# Patient Record
Sex: Female | Born: 1985 | Race: White | Hispanic: No | Marital: Married | Smoking: Never smoker
Health system: Southern US, Community
[De-identification: ages and names within clinical notes are randomized; demographics above are authoritative.]

## PROBLEM LIST (undated history)

## (undated) ENCOUNTER — Inpatient Hospital Stay (HOSPITAL_COMMUNITY): Payer: Self-pay

## (undated) DIAGNOSIS — F988 Other specified behavioral and emotional disorders with onset usually occurring in childhood and adolescence: Secondary | ICD-10-CM

## (undated) DIAGNOSIS — Z8711 Personal history of peptic ulcer disease: Secondary | ICD-10-CM

## (undated) DIAGNOSIS — Z9889 Other specified postprocedural states: Secondary | ICD-10-CM

## (undated) DIAGNOSIS — D649 Anemia, unspecified: Secondary | ICD-10-CM

## (undated) DIAGNOSIS — U071 COVID-19: Secondary | ICD-10-CM

## (undated) DIAGNOSIS — Z860101 Personal history of adenomatous and serrated colon polyps: Secondary | ICD-10-CM

## (undated) DIAGNOSIS — Z800 Family history of malignant neoplasm of digestive organs: Secondary | ICD-10-CM

## (undated) DIAGNOSIS — K219 Gastro-esophageal reflux disease without esophagitis: Secondary | ICD-10-CM

## (undated) DIAGNOSIS — N393 Stress incontinence (female) (male): Secondary | ICD-10-CM

## (undated) DIAGNOSIS — Z86010 Personal history of colonic polyps: Secondary | ICD-10-CM

## (undated) DIAGNOSIS — T7840XA Allergy, unspecified, initial encounter: Secondary | ICD-10-CM

## (undated) DIAGNOSIS — K589 Irritable bowel syndrome without diarrhea: Secondary | ICD-10-CM

## (undated) DIAGNOSIS — F32A Depression, unspecified: Secondary | ICD-10-CM

## (undated) DIAGNOSIS — R112 Nausea with vomiting, unspecified: Secondary | ICD-10-CM

## (undated) DIAGNOSIS — Z8719 Personal history of other diseases of the digestive system: Secondary | ICD-10-CM

## (undated) DIAGNOSIS — Z87442 Personal history of urinary calculi: Secondary | ICD-10-CM

## (undated) DIAGNOSIS — Z8619 Personal history of other infectious and parasitic diseases: Secondary | ICD-10-CM

## (undated) DIAGNOSIS — F419 Anxiety disorder, unspecified: Secondary | ICD-10-CM

## (undated) HISTORY — DX: Irritable bowel syndrome, unspecified: K58.9

## (undated) HISTORY — DX: Allergy, unspecified, initial encounter: T78.40XA

## (undated) HISTORY — DX: Personal history of other infectious and parasitic diseases: Z86.19

## (undated) HISTORY — PX: OTHER SURGICAL HISTORY: SHX169

## (undated) HISTORY — PX: COLONOSCOPY W/ POLYPECTOMY: SHX1380

---

## 2005-03-22 ENCOUNTER — Ambulatory Visit (HOSPITAL_COMMUNITY): Admission: RE | Admit: 2005-03-22 | Discharge: 2005-03-22 | Payer: Self-pay | Admitting: Family Medicine

## 2005-05-24 ENCOUNTER — Other Ambulatory Visit: Admission: RE | Admit: 2005-05-24 | Discharge: 2005-05-24 | Payer: Self-pay | Admitting: Obstetrics and Gynecology

## 2005-10-07 ENCOUNTER — Encounter (HOSPITAL_COMMUNITY): Admission: RE | Admit: 2005-10-07 | Discharge: 2005-10-18 | Payer: Self-pay | Admitting: Family Medicine

## 2005-10-11 ENCOUNTER — Ambulatory Visit: Payer: Self-pay | Admitting: Internal Medicine

## 2005-10-18 ENCOUNTER — Encounter (INDEPENDENT_AMBULATORY_CARE_PROVIDER_SITE_OTHER): Payer: Self-pay | Admitting: General Surgery

## 2005-10-18 ENCOUNTER — Ambulatory Visit (HOSPITAL_COMMUNITY): Admission: RE | Admit: 2005-10-18 | Discharge: 2005-10-18 | Payer: Self-pay | Admitting: General Surgery

## 2005-10-18 HISTORY — PX: LAPAROSCOPIC CHOLECYSTECTOMY: SUR755

## 2005-11-15 ENCOUNTER — Ambulatory Visit: Payer: Self-pay | Admitting: Internal Medicine

## 2005-11-15 ENCOUNTER — Encounter (INDEPENDENT_AMBULATORY_CARE_PROVIDER_SITE_OTHER): Payer: Self-pay | Admitting: *Deleted

## 2005-11-15 DIAGNOSIS — Z860100 Personal history of colon polyps, unspecified: Secondary | ICD-10-CM | POA: Insufficient documentation

## 2005-12-13 ENCOUNTER — Ambulatory Visit: Payer: Self-pay | Admitting: Internal Medicine

## 2005-12-26 ENCOUNTER — Ambulatory Visit: Payer: Self-pay | Admitting: Internal Medicine

## 2006-02-04 ENCOUNTER — Emergency Department (HOSPITAL_COMMUNITY): Admission: EM | Admit: 2006-02-04 | Discharge: 2006-02-05 | Payer: Self-pay | Admitting: Emergency Medicine

## 2006-06-23 ENCOUNTER — Other Ambulatory Visit: Admission: RE | Admit: 2006-06-23 | Discharge: 2006-06-23 | Payer: Self-pay | Admitting: Obstetrics and Gynecology

## 2007-04-09 ENCOUNTER — Emergency Department (HOSPITAL_COMMUNITY): Admission: EM | Admit: 2007-04-09 | Discharge: 2007-04-09 | Payer: Self-pay | Admitting: Emergency Medicine

## 2007-11-06 ENCOUNTER — Other Ambulatory Visit: Admission: RE | Admit: 2007-11-06 | Discharge: 2007-11-06 | Payer: Self-pay | Admitting: Obstetrics and Gynecology

## 2007-11-23 IMAGING — NM NM HEPATO W/GB/PHARM/[PERSON_NAME]
2 series · 12 of 12 positions shown · non-contrast
Comparison: none

HISTORY: Abdominal pain

[Series 1: gb hepatobiliary scan · 3.27mm/px · 6 of 60 frames shown (1 of 2)]
[frame 6/60]
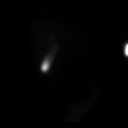
[frame 16/60]
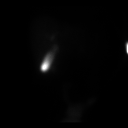
[frame 26/60]
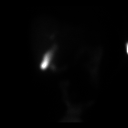
[frame 36/60]
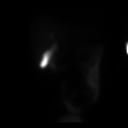
[frame 46/60]
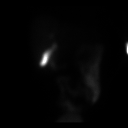
[frame 56/60]
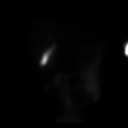

[Series 1: gb hepatobiliary scan · 3.27mm/px · 6 of 60 frames shown (2 of 2)]
[frame 6/60]
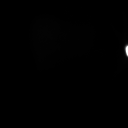
[frame 16/60]
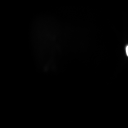
[frame 26/60]
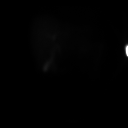
[frame 36/60]
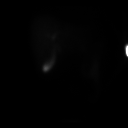
[frame 46/60]
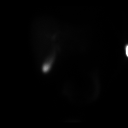
[frame 56/60]
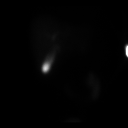

[12 of 12 positions shown; findings below may reference images not displayed]

HEPATOBILIARY SCAN WITH EJECTION FRACTION:

Hepatobiliary imaging performed using 5 mCi 1c-IIm mebrofenin.

Prompt tracer extraction from blood stream, indicating normal hepatocellular
function.
Prompt excretion of tracer into biliary tree.
Gallbladder visualized x 15 minutes.
Small bowel activity seen x 25 minutes.
No hepatic retention of tracer.

At 1 hour, patient ingested half-and-half, and imaging continued for 60 minutes.
Only mild emptying of tracer occurs from gallbladder following fatty meal
stimulation.
Calculated gallbladder ejection fraction decreased at 32%.
IMPRESSION: Patent biliary tree.
Abnormal gallbladder response to fatty meal stimulation with decreased GB EF of
32%.

## 2008-12-07 ENCOUNTER — Other Ambulatory Visit: Admission: RE | Admit: 2008-12-07 | Discharge: 2008-12-07 | Payer: Self-pay | Admitting: Obstetrics and Gynecology

## 2008-12-29 ENCOUNTER — Ambulatory Visit: Payer: Self-pay | Admitting: Internal Medicine

## 2009-01-10 ENCOUNTER — Ambulatory Visit: Payer: Self-pay | Admitting: Internal Medicine

## 2010-03-01 ENCOUNTER — Telehealth: Payer: Self-pay | Admitting: Internal Medicine

## 2010-03-06 ENCOUNTER — Other Ambulatory Visit: Admission: RE | Admit: 2010-03-06 | Discharge: 2010-03-06 | Payer: Self-pay | Admitting: Obstetrics and Gynecology

## 2010-03-28 ENCOUNTER — Ambulatory Visit: Payer: Self-pay | Admitting: Internal Medicine

## 2010-03-28 DIAGNOSIS — R198 Other specified symptoms and signs involving the digestive system and abdomen: Secondary | ICD-10-CM | POA: Insufficient documentation

## 2010-03-28 DIAGNOSIS — K589 Irritable bowel syndrome without diarrhea: Secondary | ICD-10-CM | POA: Insufficient documentation

## 2010-03-28 DIAGNOSIS — K649 Unspecified hemorrhoids: Secondary | ICD-10-CM | POA: Insufficient documentation

## 2010-07-26 ENCOUNTER — Ambulatory Visit: Payer: Self-pay | Admitting: Internal Medicine

## 2010-11-29 NOTE — Progress Notes (Signed)
Summary: Rectal Bleed  Phone Note Call from Patient   Caller: Aundria Mems [phone] Call For: Dr Leone Payor Reason for Call: Talk to Nurse Summary of Call: Having some rectal bleeding. Wonders if she can be seen fairly soon. Initial call taken by: Leanor [facility],  Mar 01, 2010 8:12 AM  Follow-up for Phone Call        Patient c/ rectal bleeding, itching and burning.  She sees blood on the tissue when she wipes.  Patient  is scheduled for 03-28-10 2:30.  Patient instructed to soak in tub of warm water/sitz bath 10 minutes BID, Tucs pads, meticulous cleaning between bowel movements and apply OTC hydrocortisone cream or Prep H.  She will call back for worsening symptoms before her appointment  Follow-up by: Darcey Nora RN, CGRN,  Mar 01, 2010 9:31 AM

## 2010-11-29 NOTE — Consult Note (Signed)
Summary: GI Consult/[facility]  GI Consult/[facility]   Imported By: Sherian Rein 03/29/2010 09:31:28  _____________________________________________________________________  External Attachment:    Type:   Image     Comment:   External Document

## 2010-11-29 NOTE — Letter (Signed)
Summary: Education officer, [facility]   Imported By: Sherian Rein 03/29/2010 09:32:48  _____________________________________________________________________  External Attachment:    Type:   Image     Comment:   External Document

## 2010-11-29 NOTE — Assessment & Plan Note (Signed)
Summary: rectal bleeding/hx of polyps/sheri   History of Present Illness Visit Type: Follow-up Visit Primary GI MD: Stan Head MD [facility] Chief Complaint: Lower abd pain and rectal pain with BM. Pt noticed a episode of black stools and also has rectal bleeding with some BM's. Also has acid reflux and sour taste in mouth.  History of Present Illness:   25 yo white woman een in past for gallbladder dyskinesia, suspected IBS and rectal adenoma. Laast seen at 2010 colonoscopy. She recently called about rectal bleeding. Is having 3-4 stools a day that are variable from loose to hard and different sizes. Dark stools occurred after Pepto-Bismol. In the AM she has a strange sensation like there is something moving around in the abdomen. She had bright red blood on the tissue paper and on the stool at times. that has stopped x 2 weeks. She used preparation H with benefit. Also having halitosis problems, ntes after peanuts, pizza, ? sub sandwich (steak and cheese). She clears her throat alot and has phlegm that she expectorates intermittently. She was evaluatd by ENT (? Jearld Fenton) and was told GERD is a possibility as a cause. she reduced from 1-2 cups of coffee. CT of sinuses ok she says, and was having sinus or post-nasal drainage. also busy with school, stressed though that has subsided some.  due to graduate 11/11   GI Review of Systems    Reports abdominal pain, acid reflux, and  belching.     Location of  Abdominal pain: lower abdomen.    Denies bloating, chest pain, dysphagia with liquids, dysphagia with solids, heartburn, loss of appetite, nausea, vomiting, vomiting blood, weight loss, and  weight gain.      Reports change in bowel habits, hemorrhoids, rectal bleeding, and  rectal pain.     Denies anal fissure, black tarry stools, diarrhea, diverticulosis, fecal incontinence, heme positive stool, irritable bowel syndrome, jaundice, light color stool, and  liver problems. Preventive  Screening-Counseling & Management  Alcohol-Tobacco     Smoking Status: never      Drug Use:  no.      Current Medications (verified): 1)  Ortho Tri-Cyclen Lo 0.18/0.215/0.25 Mg-25 Mcg Tabs (Norgestim-Eth Estrad Triphasic) .... One Tablet By Mouth Once Daily 2)  Ibuprofen 800 Mg Tabs (Ibuprofen) .Marland Kitchen.. 1 By Mouth Prn  Allergies (verified): 1)  Vicodin 2)  Codeine  Past History:  Past Medical History: ADD Allergy/Sinus Adenomatous Colon Polyps Gallstones Kidney Stones Hemorrhoids IBS?  Past Surgical History: Reviewed history from 03/23/2010 and no changes required. 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  Alcohol Use - yes Daily Caffeine Use Illicit Drug Use - no Smoking Status:  never Drug Use:  no  Review of Systems       as per HPI  Vital Signs:  Patient profile:   25 year old female Height:      63.25 inches Weight:      122.38 pounds BMI:     21.59 Pulse rate:   64 / minute Pulse rhythm:   regular BP sitting:   108 / 62  (right arm) Cuff size:   regular  Vitals Entered By: Christie Nottingham CMA Duncan Dull) (March 28, 2010 2:52 PM)  Physical Exam  General:  Well developed, well nourished, no acute distress. Eyes:  PERRLA, no icterus. Mouth:  No deformity or lesions, dentition normal. Neck:  Supple; no masses or thyromegaly.  Lungs:  Clear throughout to auscultation. Heart:  Regular rate and rhythm; no murmurs, rubs,  or bruits. Abdomen:  Soft, nontender and nondistended. No masses, hepatosplenomegaly or hernias noted. Normal bowel sounds. Rectal:  inspected with female staff present no perianal changes noted, small external hemorhoids visible in canal   Impression & Recommendations:  Problem # 1:  HEMORRHOIDS, WITH BLEEDING (ICD-455.8) Assessment Deteriorated known problem from prior colonoscopies with  signs and symptoms compatible since she called this has resolved so observe for now and try to improve bowel habit  Problem # 2:  CHANGE IN BOWELS (ZOX-096.04) Assessment: Deteriorated Seems like an exacerbation of IBS. Duodenal bxs normal in 2007 but will do bloodwork for celiac disease. Orders: TLB-IgA (Immunoglobulin A) ([id number]) T-Tissue Transglutamase Ab IgA ([id number])  Problem # 3:  IRRITABLE BOWEL SYNDROME (ICD-564.1) Assessment: Deteriorated I think this is her main problem as she has had chronic, intermittent abdominal pain, bloating and bowel habit changes. Colonoscopies x 2 have not shown other disease to explain and duodenal bxs 2007 negative for celiac disease. Will look further for celiac disease with TTG Ab and IgA level today. Fiber (metamucil) and Align are rxed.  Problem # 4:  ? of GERD (ICD-530.81) Assessment: New ENT symptoms and some upper abdominal pain raise the ? Trial of PPI at once daily Nexium 40 mg Reassess in 2 months  Patient Instructions: 1)  Please go to the basement to have your lab tests drawn today. 2)  Take 1 teaspoon of metamucil daily x 1 week, then 2 teaspoons daily x 1 week then 3 teaspoons (1 tablespoon) daiy. May adjust dose as needed, up or down.l  3)  Please pick up your medications at your pharmacy. 4)  You are starting Nexium as written below. 5)  Also start Align 1 each day as written below for 1 month then stop (early July) 6)  Please schedule a follow-up appointment in 2 months.  7)  Copy sent to : [facility] 8)  The medication list was reviewed and reconciled.  All changed / newly prescribed medications were explained.  A complete medication list was provided to the patient / caregiver. Prescriptions: NEXIUM 40 MG PACK (ESOMEPRAZOLE MAGNESIUM) 1 by mouth 30 minutes before breakfast  #30 x 1   Entered and Authorized by:   Iva Boop MD, [facility]   Signed by:   Iva Boop MD, FACG on 03/28/2010   Method used:    Electronically to        [facility] (retail)       [address]       [place], [place]  [address]       Ph: [phone]       Fax: [phone]   RxID:   [id number]  cc: Suzanna Obey, MD

## 2010-11-29 NOTE — Procedures (Signed)
Summary: Flex Sig: Adenoma   Flexible Sigmoidoscopy  Procedure date:  11/15/2005  Findings:      Results: Adenomatous Polyp  Results: Hemorrhoids.   Comments:      Location: [facility].  Patient Name: Genessa, Beman MRN: [id number] Procedure Procedures: Flexible Proctosigmoidoscopy CPT: [phone].    with polypectomy. CPT: I988382.  Personnel: Endoscopist: Iva Boop, MD, [facility].  Exam Location: Exam performed in Outpatient Clinic. Outpatient  Patient Consent: Procedure, Alternatives, Risks and Benefits discussed, consent obtained, from patient. Consent was obtained by the RN.  Indications Symptoms: Rectal Bleeding.  History  Current Medications: Patient is not currently taking Coumadin.  Allergies: No known allergies. Allergic to sensitive to hydrocodone and codeine.  Pre-Exam Physical: Performed Nov 15, 2005. Cardio-pulmonary exam  WNL. HEENT exam  WNL. Abdominal exam abnormal. Mental status exam WNL. Abnormal PE findings include: mildly tender LUQ and epigastrium.  Comments: Pt. history reviewed/updated, physical exam performed prior to sedation? YES Exam Exam: Extent visualized: Transverse Colon. Extent of exam: 90 cm. Patient position: on left side. Images taken. ASA Classification: I. Tolerance: fair, adequate exam.  Monitoring: Pulse and BP monitoring, Oximetry used. Supplemental O2 given.  Colon Prep Used Fleets enema for colon prep. Prep: excellent.  Sedation Meds: Patient assessed and found to be appropriate for moderate (conscious) sedation. Fentanyl 25 mcg. given IV. Versed 2 mg. given IV.  Findings - NORMAL EXAM: Transverse Colon to Sigmoid Colon.  POLYP: Rectum, Maximum size: 10 mm. pedunculated polyp. Procedure:  snare with cautery, removed, Polyp retrieved, sent to pathology.  HEMORRHOIDS: External. Size: Small. ICD9: Hemorrhoids, External: 455. 3.   Assessment  Diagnoses: 455.0: Hemorrhoids, Internal.  455.3:  Hemorrhoids, External.   Comments: Rectal polyp is probably a juvenile or hyperplastic polyp but will need to see what pathology shows.  Suspect Irritable bowel syndrome also. If needs a full colonoscopy may need Propofol. Events  Unplanned Intervention: No intervention was required.  Plans  Post Exam Instructions: No aspirin or non-steroidal containing medications: 2 weeks.  Patient Education: Patient given standard instructions for: Polyps.  Disposition: After procedure patient sent to recovery. After recovery patient sent home.  Scheduling: Clinic Visit, to Iva Boop, MD, [facility], 1 month,   CC:   Assunta Found, MD   Franky Macho, MD  This report was created from the original endoscopy report, which was reviewed and signed by the above listed endoscopist.

## 2010-11-29 NOTE — Procedures (Signed)
Summary: Colonoscopy: Hemorrhoids, No polyps   Colonoscopy  Procedure date:  12/26/2005  Findings:      Results: Hemorrhoids.     Location:  [facility].    Procedures Next Due Date:    Colonoscopy: 12/2008  Patient Name: Kathryn, Roberts MRN: [id number] Procedure Procedures: Colonoscopy CPT: 98119.  Personnel: Endoscopist: Iva Boop, MD, [facility].  Exam Location: Exam performed in Outpatient Clinic. Outpatient  Patient Consent: Procedure, Alternatives, Risks and Benefits discussed, consent obtained, from patient. Consent was obtained by the RN.  Indications  Evaluation of: Polyps seen on recent Flexible Sigmoidoscopy.  Surveillance of: Adenomatous Polyp(s).  Comments: Had a 10 mm adenomatous polyp removed from rectum on 11/15/05, needs complete colonoscopy to look for other polyps. History  Current Medications: Patient is not currently taking Coumadin.  Allergies: No known allergies.  Pre-Exam Physical: Performed Dec 26, 2005. Cardio-pulmonary exam, Rectal exam, HEENT exam , Abdominal exam WNL.  Comments: Pt. history reviewed/updated, physical exam performed prior to initiation of sedation? YES Exam Exam: Extent of exam reached: Cecum, extent intended: Cecum.  The cecum was identified by appendiceal orifice and IC valve. Patient position: on left side. The Cecum was reached at 9:47 AM. ended at 9:54 AM. Time for Withdrawl: 00:07. Colon retroflexion performed. Images taken. ASA Classification: I. Tolerance: good.  Monitoring: Pulse and BP monitoring, Oximetry used. Supplemental O2 given.  Colon Prep Used MiraLax for colon prep. Prep results: excellent.  Sedation Meds: Patient assessed and found to be appropriate for moderate (conscious) sedation. Fentanyl 100 mcg. given IV. Versed 10 mg. given IV.  Comments: Moderate IBS response. Findings - NORMAL EXAM: Cecum to Sigmoid Colon.  HEMORRHOIDS: External. Size: Grade I. ICD9: Hemorrhoids,  External: 455.3. Comments: small.   Assessment  Diagnoses: 455.3: Hemorrhoids, External.   Comments: NO MORE POLYPS SEEN Events  Unplanned Interventions: No intervention was required.  Plans Patient Education: Patient given standard instructions for: Hemorrhoids.  Disposition: After procedure patient sent to recovery. After recovery patient sent home.  Scheduling/Referral: Colonoscopy, to Iva Boop, MD, [facility], 3 YRS,   Comments: Will consider genetic testing, will clarify her father's colonoscopy results.  CC:    Assunta Found, MD   Franky Macho, MD  This report was created from the original endoscopy report, which was reviewed and signed by the above listed endoscopist.

## 2010-11-29 NOTE — Assessment & Plan Note (Signed)
Summary: 2 months f.u...em   History of Present Illness Visit Type: Follow-up Visit Primary GI MD: Stan Head MD [facility] Requesting Provider: na Chief Complaint: bloating History of Present Illness:   Still has painful bloating after eating and has to get into fetal position or on side to expel gas to get relief. Cannot associate with certain foods. Still has halitosis. She thinks Align helped with energy, cannot remember if it helped gas. Nexium did not seem to help her. She was having severe abdominal pain while defecating on Nexium also.   GI Review of Systems    Reports bloating.      Denies abdominal pain, acid reflux, belching, chest pain, dysphagia with liquids, dysphagia with solids, heartburn, loss of appetite, nausea, vomiting, vomiting blood, weight loss, and  weight gain.        Denies anal fissure, black tarry stools, change in bowel habit, constipation, diarrhea, diverticulosis, fecal incontinence, heme positive stool, hemorrhoids, irritable bowel syndrome, jaundice, light color stool, liver problems, rectal bleeding, and  rectal pain.    Current Medications (verified): 1)  Ortho Tri-Cyclen Lo 0.18/0.215/0.25 Mg-25 Mcg Tabs (Norgestim-Eth Estrad Triphasic) .... One Tablet By Mouth Once Daily 2)  Ibuprofen 800 Mg Tabs (Ibuprofen) .Marland Kitchen.. 1 By Mouth Prn  Allergies (verified): 1)  Vicodin 2)  Codeine  Past History:  Past Medical History: ADD Allergy/Sinus Adenomatous Colon Polyps Gallstones Kidney Stones Hemorrhoids IBS? Gastritis   Past Surgical History: Reviewed history from 03/23/2010 and no changes required. Plastic surgery age 69 Cholecystectomy  Family History: Reviewed history from 03/23/2010 and no changes required. Family History of Diabetes: MGM, PGM Family History of Irritable Bowel Syndrome: GM Family History of Colon Cancer: Maternal Great Grand Father Family History of Colon Polyps: Father  Social History: Reviewed history from 03/28/2010 and  no changes required. 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  Alcohol Use - yes Daily Caffeine Use Illicit Drug Use - no  Vital Signs:  Patient profile:   25 year old female Height:      63.25 inches Weight:      123 pounds BMI:     21.69 BSA:     1.58 Pulse rate:   68 / minute Pulse rhythm:   regular BP sitting:   100 / 60  (left arm) Cuff size:   regular  Vitals Entered By: Ok Anis CMA (July 26, 2010 11:19 AM)  Physical Exam  General:  Well developed, well nourished, no acute distress.   Impression & Recommendations:  Problem # 1:  IRRITABLE BOWEL SYNDROME (ICD-564.1) Assessment Improved She responded well with overall well-being on align. This suggests a component of bacterial overgowth. resume Align on a daily basis. If fails that empiric antibiotics vs breat testing.  Problem # 2:  ? of GERD (ICD-530.81) probably not since Aign helped  Patient Instructions: 1)  Take Align once daily continuously. If it stops helping your Irritable Bowel Syndrome then call back and as long as nothing else new Dr. Leone Payor will prescribe a course of antibiotics. 2)  It sounds like you have bacterial overgrowth of the small intestine. 3)  IBS brochure given.  4)  Copy sent to : Karleen Hampshire, MD 5)  The medication list was reviewed and reconciled.  All changed / newly prescribed medications were explained.  A complete medication list was provided to the patient / caregiver.

## 2010-11-29 NOTE — Procedures (Signed)
Summary: EGD: Gastritis   EGD  Procedure date:  11/15/2005  Findings:      Findings: Gastritis  Location: [facility]   Patient Name: Kathryn, Roberts MRN: [id number] Procedure Procedures: Panendoscopy (EGD) CPT: 43235.    with biopsy(s)/brushing(s). CPT: [id number].  Personnel: Endoscopist: Iva Boop, MD, [facility].  Exam Location: Exam performed in Outpatient Clinic. Outpatient  Patient Consent: Procedure, Alternatives, Risks and Benefits discussed, consent obtained, from patient. Consent was obtained by the RN.  Indications Symptoms: Abdominal pain, location: epigastric.  History  Current Medications: Patient is not currently taking Coumadin.  Allergies: No known allergies. Patient is allergic to sensitive to hydrocodone and codeine.  Comments: Had cholecystectomy and is better but still with epigastric and LUQ pain, post-prandial diarrhea and rectal bleeding. Pre-Exam Physical: Performed Nov 15, 2005  Cardio-pulmonary exam, HEENT exam WNL. Abdominal exam abnormal. Mental status exam WNL. Abnormal PE findings include: mildly tender LUQ and epigastrium.  Comments: Pt. history reviewed/updated, physical exam performed prior to initiation of sedation? YES Exam Exam Info: Maximum depth of insertion Duodenum, intended Duodenum. Patient position: on left side. Gastric retroflexion performed. Images taken. ASA Classification: I. Tolerance: fair, adequate exam.  Sedation Meds: Patient assessed and found to be appropriate for moderate (conscious) sedation. Fentanyl 50 mcg. given IV. Versed 8 mg. given IV. Cetacaine Spray 2 sprays given aerosolized.  Monitoring: BP and pulse monitoring done. Oximetry used. Supplemental O2 given  Findings - Normal: Proximal Esophagus to Distal Esophagus.  - Normal: Cardia to Body.  - Normal: Duodenal Bulb to Duodenal 2nd Portion. Biopsy/Normal taken.  - MUCOSAL ABNORMALITY: Antrum. Erythematous mucosa. Biopsy/Mucosal Abn  taken.   Assessment  Comments: Antral gastritis suspected, biopsies pending. Duodenum biopsied to look for sprue. Events  Unplanned Intervention: No unplanned interventions were required.  Plans Comments: Treat H. pylori if found in biopsies. Start Prilosec OTC 20 mg/day. Disposition: After procedure patient sent to recovery. After recovery patient sent home.  Scheduling: Flexible Sigmoidoscopy, to Iva Boop, MD, [facility], next   CC:   Assunta Found, MD   Franky Macho, MD  This report was created from the original endoscopy report, which was reviewed and signed by the above listed endoscopist.

## 2010-12-21 ENCOUNTER — Telehealth: Payer: Self-pay | Admitting: Internal Medicine

## 2010-12-25 ENCOUNTER — Ambulatory Visit: Payer: Self-pay | Admitting: Nurse Practitioner

## 2010-12-25 ENCOUNTER — Ambulatory Visit (INDEPENDENT_AMBULATORY_CARE_PROVIDER_SITE_OTHER): Payer: BC Managed Care – PPO | Admitting: Nurse Practitioner

## 2010-12-25 ENCOUNTER — Encounter: Payer: Self-pay | Admitting: Nurse Practitioner

## 2010-12-25 DIAGNOSIS — K6289 Other specified diseases of anus and rectum: Secondary | ICD-10-CM

## 2010-12-25 NOTE — Progress Notes (Signed)
Summary: Triage  Phone Note Call from Patient Call back at Home Phone [phone]   Caller: Patient Call For: Dr. Leone Payor Reason for Call: Talk to Nurse Summary of Call: Intense pain when she has a BM, rectal bleeding Initial call taken by: Karna Christmas,  December 21, 2010 11:19 AM  Follow-up for Phone Call        patient reports pain with BM and rectal bleeding.  She is scheduled to see Willette Cluster RNP on 12/25/10 10:30.  She is asked to make sure she is on  a high fiber diet and a stool softener until office visit next week. Follow-up by: Darcey Nora RN, CGRN,  December 21, 2010 2:57 PM

## 2011-01-03 NOTE — Assessment & Plan Note (Addendum)
Summary: rectal pain and bleeding with BM   History of Present Illness Visit Type: Follow-up Visit Primary GI MD: Stan Head MD [facility] Primary Provider: Theodis Sato, MD Requesting Provider: na Chief Complaint: rectal pain and bleeding on rt. side of rectum.   History of Present Illness:   Followed by Dr. Leone Payor for history colon polyps, IBS and possibly GERD.  Patient here for evaluation of rectal pain and bleeding. When she has a bowel movement it feels as though someone is cutting her with a knife. She had an episode of severe rectal pain 2 weeks ago but after taking Align and Metamucil, the pain resolved for unclear reasons (her stools were already soft).  Her rectal pain recurred and now with minor bleeding. Stools are still soft and she has 3-5 BMs a day.  No GERD symptoms   GI Review of Systems      Denies abdominal pain, acid reflux, belching, bloating, chest pain, dysphagia with liquids, dysphagia with solids, heartburn, loss of appetite, nausea, vomiting, vomiting blood, weight loss, and  weight gain.      Reports rectal bleeding and  rectal pain.     Denies anal fissure, black tarry stools, change in bowel habit, constipation, diarrhea, diverticulosis, fecal incontinence, heme positive stool, hemorrhoids, irritable bowel syndrome, jaundice, light color stool, and  liver problems.    Allergies: 1)  Vicodin 2)  Codeine  Past History:  Past Medical History: Reviewed history from 07/26/2010 and no changes required. ADD Allergy/Sinus Adenomatous Colon Polyps Gallstones Kidney Stones Hemorrhoids IBS? Gastritis   Past Surgical History: Reviewed history from 03/23/2010 and no changes required. Plastic surgery age 52 Cholecystectomy  Family History: Reviewed history from 03/23/2010 and no changes required. Family History of Diabetes: MGM, PGM Family History of Irritable Bowel Syndrome: GM Family History of Colon Cancer: Maternal Great Grand Father Family History  of Colon Polyps: Father  Social History: Reviewed history from 07/26/2010 and no changes required. 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  Alcohol Use - yes Daily Caffeine Use Illicit Drug Use - no  Review of Systems       The patient complains of headaches-new.  The patient denies allergy/sinus, anemia, anxiety-new, arthritis/joint pain, back pain, blood in urine, breast changes/lumps, change in vision, confusion, cough, coughing up blood, depression-new, fainting, fatigue, fever, hearing problems, heart murmur, heart rhythm changes, itching, menstrual pain, muscle pains/cramps, night sweats, nosebleeds, pregnancy symptoms, shortness of breath, skin rash, sleeping problems, sore throat, swelling of feet/legs, swollen lymph glands, thirst - excessive , urination - excessive , urination changes/pain, urine leakage, vision changes, and voice change.    Vital Signs:  Patient profile:   25 year old female Height:      63.25 inches Weight:      122 pounds BMI:     21.52 Pulse rate:   68 / minute Pulse rhythm:   regular BP sitting:   98 / 60  (left arm)  Vitals Entered By: Milford Cage NCMA (December 25, 2010 11:13 AM)  Physical Exam  General:  Well developed, well nourished, no acute distress. Head:  Normocephalic and atraumatic. Neck:  no obvious masses  Lungs:  Clear throughout to auscultation. Heart:  Regular rate and rhythm; no murmurs, rubs,  or bruits. Abdomen:  Soft, nontender and nondistended. No masses, hepatosplenomegaly or hernias noted. Normal bowel sounds. Rectal:  No external lesions appreciated. Limited anoscopy due to patient's physical discomfort during DRE.  Msk:  Symmetrical with no gross deformities. Normal posture. Extremities:  No palmar erythema, no edema.  Neurologic:  Alert and  oriented x4;  grossly normal neurologically. Skin:  Intact without significant lesions  or rashes. Cervical Nodes:  No significant cervical adenopathy. Psych:  Alert and cooperative. Normal mood and affect.   Impression & Recommendations:  Problem # 1:  RECTAL PAIN (ZOX-096.04) Assessment New Etiology not clear. She could have a fissure just inside anal canal that I was unable to visualize, she certainly had discomfort there on exam. Will try Diltiazem Gel. Xylocaine jelly for local pain. Avoid constipation.. Call in two weeks with condition update.  Problem # 2:  PERSONAL HX COLONIC POLYPS (ICD-V12.72) Adenomatous colon polyp. Two unremarkable colonoscopies since. Surveillance examination March 2010.   Patient Instructions: 1)  We have given you samples of Align Capsules to take 1 cap daily. 2)  We called into [facility] for the Xylocaine Jelly, and Diltiazem Gel 2 %.  3)  Call us in 10 days with a progress report. 4)  Copy sent to :  Theodis Sato, MD 5)  The medication list was reviewed and reconciled.  All changed / newly prescribed medications were explained.  A complete medication list was provided to the patient / caregiver. Prescriptions: DILTIAZEM GEL 2 % Use 5 times daily rectally as needed for rectal pain  #30 cc x 0   Entered by:   Lowry Ram NCMA   Authorized by:   Willette Cluster NP   Signed by:   Willette Cluster NP on 12/25/2010   Method used:   Telephoned to ...       [facility]* (retail)       [address]       [place], [place]  [id number]       Ph: [phone]       Fax: [phone]   RxID:   [id number] XYLOCAINE JELLY 2 % GEL (LIDOCAINE HCL) USe 3-4 daily as needed for rectal pain  #30 cc x 0   Entered by:   Lowry Ram NCMA   Authorized by:   Willette Cluster NP   Signed by:   Willette Cluster NP on 12/25/2010   Method used:   Telephoned to ...       [facility] (retail)       [address]       [place], [place]  [address]       Ph: [phone]       Fax:  [phone]   RxID:   [id number]  We called the Xylocaine Jelly 2 % into [facility], [place], [place].

## 2011-03-15 NOTE — H&P (Signed)
NAME:  Kathryn Roberts, Kathryn Roberts              ACCOUNT NO.:  [id number]   MEDICAL RECORD NO.:  [id number]          PATIENT TYPE:  AMB   LOCATION:  DAY                           FACILITY:  [facility]   PHYSICIAN:  Dalia Heading, M.D.  DATE OF BIRTH:  Jan 04, 1986   DATE OF ADMISSION:  DATE OF DISCHARGE:  LH                                HISTORY & PHYSICAL   CHIEF COMPLAINT:  Chronic cholecystitis.   HISTORY OF PRESENT ILLNESS:  The patient is a 25 year old white female who  is referred for evaluation and treatment of biliary colic secondary to  chronic cholecystitis.  She has been having right upper quadrant abdominal  pain with radiation to the right flank, nausea, bloating for several weeks.  She does have fatty food intolerance.  No fever, chills, or jaundice have  been noted.  Her symptoms have been worsening.   PAST MEDICAL HISTORY:  Unremarkable.   PAST SURGICAL HISTORY:  Cyst removal on her forehead in the remote past.   CURRENT MEDICATIONS:  1.  Yasmin.  2.  Folic acid.  3.  Naprosyn p.r.n.  4.  Dicyclomine p.r.n.   ALLERGIES:  No known drug allergies.   REVIEW OF SYSTEMS:  Noncontributory.   PHYSICAL EXAMINATION:  GENERAL:  The patient is a well-developed, well-  nourished white female in no acute distress.  HEENT:  No scleral icterus.  LUNGS:  Clear to auscultation with equal breath sounds bilaterally.  HEART:  Regular rate and rhythm without S3, S4, murmurs.  ABDOMEN:  Soft and nondistended.  She is tender in the right upper quadrant  to palpation.  No hepatosplenomegaly, masses, or hernias are identified.   LABORATORY DATA:  Ultrasound of the gallbladder reveals no cholelithiasis.  HIDA scan reveals chronic cholecystitis with a low gallbladder ejection  fraction and reproducible symptoms.   IMPRESSION:  Chronic cholecystitis.   PLAN:  The patient is scheduled for a laparoscopic cholecystectomy on  October 18, 2005.  The risks and benefits of the procedure, including  bleeding, infection, hepatobiliary injury, and the possibility of an open  procedure, were fully explained to the patient, who gave informed consent.      Dalia Heading, M.D.  Electronically Signed     MAJ/MEDQ  D:  10/17/2005  T:  10/17/2005  Job:  [id number]   cc:   Short Stay at [facility]   Corrie Mckusick, M.D.  Fax: [phone]

## 2011-03-15 NOTE — Op Note (Signed)
NAME:  Kathryn Roberts, Kathryn Roberts              ACCOUNT NO.:  [id number]   MEDICAL RECORD NO.:  [id number]          PATIENT TYPE:  AMB   LOCATION:  DAY                           FACILITY:  [facility]   PHYSICIAN:  Dalia Heading, M.D.  DATE OF BIRTH:  [birth year]   DATE OF PROCEDURE:  10/18/2005  DATE OF DISCHARGE:                                 OPERATIVE REPORT   PREOPERATIVE DIAGNOSIS:  Chronic cholecystitis.   POSTOPERATIVE DIAGNOSIS:  Chronic cholecystitis.   PROCEDURE:  Laparoscopic cholecystectomy.   SURGEON:  Dr. Lovell Sheehan.   ASSISTANT:  Dr. Arna Snipe.   ANESTHESIA:  General endotracheal.   INDICATIONS:  The patient is a 25 year old white female who presents with  biliary colic secondary to chronic cholecystitis. Risks and benefits of the  procedure including bleeding, infection, hepatobiliary injury and the  possibility of an open procedure were fully explained to the patient, who  gave informed consent.   PROCEDURE NOTE:  The patient is placed in a supine position. After induction  of general endotracheal anesthesia, the abdomen was prepped and draped in  the usual sterile technique with Betadine. Surgical site confirmation was  performed.   An infraumbilical incision was made down to fascia. Veress needle was  introduced into the abdominal cavity, and confirmation of placement was done  using the saline drop test. The abdomen was then insufflated to 16 mmHg  pressure. An 11-mm trocar was introduced into the abdominal cavity under  direct visualization without difficulty. The patient was placed in reversed  Trendelenburg position. An additional 11-mm trocar was placed epigastric  region, and 5-mm trocars were placed in the right upper quadrant and right  flank regions. Liver was inspected and noted to be within normal limits. The  gallbladder was retracted superiorly and laterally. Dissection was begun  around the infundibulum of the gallbladder. The cystic duct was first  identified. Its juncture to the infundibulum fully identified. Endoclips  were placed proximally and distally on the cystic duct, and cystic duct was  divided. This was likewise done to the cystic artery. Gallbladder was then  freed away from the gallbladder fossa using Bovie electrocautery. The  gallbladder was delivered through the epigastric trocar site using EndoCatch  bag. The gallbladder fossa was inspected and no abnormal bleeding or bile  leakage was noted. Surgicel was placed in the gallbladder fossa. All fluid  and air were then evacuated from the abdominal cavity prior to removal of  the trocars.   All wounds were irrigated with normal saline. All wounds were checked with  0.5%Sensorcaine. The infraumbilical fascia was reapproximated using a 0  Vicryl interrupted suture. All skin incisions were closed using a 4-0 Vicryl  subcuticular suture and Dermabond.   All tape and needle counts were correct at the end of the procedure. The  patient was extubated in the operating room and went back to recovery room  awake in stable condition.   COMPLICATIONS:  None.   SPECIMEN:  Gallbladder.   BLOOD LOSS:  Minimal.      Dalia Heading, M.D.  Electronically Signed     MAJ/MEDQ  D:  10/18/2005  T:  10/19/2005  Job:  [id number]

## 2011-05-06 ENCOUNTER — Other Ambulatory Visit: Payer: Self-pay | Admitting: Obstetrics and Gynecology

## 2011-08-15 LAB — CULTURE, BLOOD (ROUTINE X 2): Culture: NO GROWTH

## 2011-08-15 LAB — URINALYSIS, ROUTINE W REFLEX MICROSCOPIC
Nitrite: NEGATIVE
Specific Gravity, Urine: 1.016
pH: 6.5

## 2011-08-15 LAB — COMPREHENSIVE METABOLIC PANEL
ALT: 13
Albumin: 3.4 — ABNORMAL LOW
Alkaline Phosphatase: 46
Potassium: 3.1 — ABNORMAL LOW
Sodium: 137
Total Protein: 5.8 — ABNORMAL LOW

## 2011-08-15 LAB — GRAM STAIN

## 2011-08-15 LAB — CSF CELL COUNT WITH DIFFERENTIAL
RBC Count, CSF: 0
Tube #: 4
WBC, CSF: 1

## 2011-08-15 LAB — PROTEIN AND GLUCOSE, CSF: Glucose, CSF: 74

## 2011-08-15 LAB — CSF CULTURE W GRAM STAIN: Gram Stain: NONE SEEN

## 2011-10-29 NOTE — L&D Delivery Note (Addendum)
Operative Delivery Note At 1:28 PM a viable and healthy female was delivered via Vaginal, Vacuum Investment banker, operational).  Presentation: vertex; Position: Left,, Occiput,, Anterior; Station: +3.  Verbal consent: obtained from patient.  Risks and benefits discussed in detail.  APGAR: 8, 9; weight 7 lb 12.7 oz (3535 g).   Patient developed exhaustion.  The decision was made to apply the vacuum to assist due to this.  The perineum was infiltrated with 10 cc 1% lidocaine a 2nd degree episiotomy was cut.  The vacuum was applied. Two pop-offs occurred due to difficulty generating adequate suction.  A miti-vac was called for but the same difficulty occurred with this vacuum.  The episiotomy was extended.  The kiwi was reapplied and with one additional pull the infants head was delivered.  The Infant cried vigorously after delivery.  She was passed to the isolet after a brief trip to her mother's abdomen for additional suctioning.  Approximately 17cc of mucous was suctioned from the infants oropharynx. The 3rd degree perineal laceration was repaired with 0 vicryl followed by the second degree repair with 3-0 vicryl. Rectum explored and intact and no hematoma was noted.   Placenta status: Intact, Spontaneous.   Cord: 3 vessels   Anesthesia: Epidural  Instruments: Kiwi Episiotomy: Median Lacerations: 3rd degree Suture Repair: 3.0 vicryl 0 vicryl Est. Blood Loss (mL): 450 cc  Mom to postpartum.  Baby to nursery-stable.  Keven Soucy H. 10/20/2012, 2:18 PM

## 2011-12-03 ENCOUNTER — Ambulatory Visit (HOSPITAL_COMMUNITY)
Admission: RE | Admit: 2011-12-03 | Discharge: 2011-12-03 | Disposition: A | Discharge status: Home / Self Care | Payer: BC Managed Care – PPO | Source: Ambulatory Visit | Attending: Internal Medicine | Admitting: Internal Medicine

## 2011-12-03 ENCOUNTER — Encounter (HOSPITAL_COMMUNITY): Payer: Self-pay

## 2011-12-03 ENCOUNTER — Other Ambulatory Visit (HOSPITAL_COMMUNITY): Payer: Self-pay | Admitting: Internal Medicine

## 2011-12-03 DIAGNOSIS — R079 Chest pain, unspecified: Secondary | ICD-10-CM

## 2011-12-03 MED ORDER — IOHEXOL 300 MG/ML  SOLN
80.0000 mL | Freq: Once | INTRAMUSCULAR | Status: AC | PRN
  Administered 2011-12-03: 80 mL via INTRAVENOUS

## 2011-12-24 ENCOUNTER — Encounter: Payer: Self-pay | Admitting: Internal Medicine

## 2012-04-02 LAB — OB RESULTS CONSOLE ABO/RH: RH Type: NEGATIVE

## 2012-04-02 LAB — OB RESULTS CONSOLE HEPATITIS B SURFACE ANTIGEN: Hepatitis B Surface Ag: NEGATIVE

## 2012-04-02 LAB — OB RESULTS CONSOLE HIV ANTIBODY (ROUTINE TESTING): HIV: NONREACTIVE

## 2012-04-02 LAB — OB RESULTS CONSOLE RPR: RPR: NONREACTIVE

## 2012-08-21 ENCOUNTER — Encounter: Payer: Self-pay | Admitting: Internal Medicine

## 2012-10-15 ENCOUNTER — Encounter (HOSPITAL_COMMUNITY): Payer: Self-pay | Admitting: *Deleted

## 2012-10-15 ENCOUNTER — Telehealth (HOSPITAL_COMMUNITY): Payer: Self-pay | Admitting: *Deleted

## 2012-10-15 NOTE — Telephone Encounter (Signed)
Preadmission screen  

## 2012-10-16 ENCOUNTER — Inpatient Hospital Stay (HOSPITAL_COMMUNITY): Admission: AD | Admit: 2012-10-16 | Payer: Self-pay | Source: Ambulatory Visit | Admitting: Obstetrics and Gynecology

## 2012-10-19 ENCOUNTER — Inpatient Hospital Stay (HOSPITAL_COMMUNITY)
Admission: RE | Admit: 2012-10-19 | Discharge: 2012-10-22 | DRG: 373 | Disposition: A | Discharge status: Home / Self Care | Payer: BC Managed Care – PPO | Source: Ambulatory Visit | Attending: Obstetrics & Gynecology | Admitting: Obstetrics & Gynecology

## 2012-10-19 ENCOUNTER — Encounter (HOSPITAL_COMMUNITY): Payer: Self-pay

## 2012-10-19 DIAGNOSIS — O480 Post-term pregnancy: Principal | ICD-10-CM | POA: Diagnosis present

## 2012-10-19 LAB — CBC
HCT: 32.3 % — ABNORMAL LOW (ref 36.0–46.0)
Hemoglobin: 11.1 g/dL — ABNORMAL LOW (ref 12.0–15.0)
RDW: 13.2 % (ref 11.5–15.5)
WBC: 10.6 10*3/uL — ABNORMAL HIGH (ref 4.0–10.5)

## 2012-10-19 MED ORDER — OXYTOCIN 40 UNITS IN LACTATED RINGERS INFUSION - SIMPLE MED
1.0000 m[IU]/min | INTRAVENOUS | Status: DC
  Administered 2012-10-20: 2 m[IU]/min via INTRAVENOUS

## 2012-10-19 MED ORDER — LACTATED RINGERS IV SOLN
INTRAVENOUS | Status: DC
  Administered 2012-10-19 – 2012-10-20 (×2): via INTRAVENOUS
  Administered 2012-10-20: 1000 mL via INTRAVENOUS

## 2012-10-19 MED ORDER — TERBUTALINE SULFATE 1 MG/ML IJ SOLN: 0.2500 mg | Freq: Once | INTRAMUSCULAR | Status: AC | PRN

## 2012-10-19 MED ORDER — MISOPROSTOL 25 MCG QUARTER TABLET
25.0000 ug | ORAL_TABLET | ORAL | Status: DC | PRN
  Administered 2012-10-19 – 2012-10-20 (×2): 25 ug via VAGINAL
  Filled 2012-10-19 (×2): qty 0.25

## 2012-10-19 MED ORDER — BUTORPHANOL TARTRATE 1 MG/ML IJ SOLN
1.0000 mg | INTRAMUSCULAR | Status: DC | PRN
  Administered 2012-10-20: 1 mg via INTRAVENOUS
  Filled 2012-10-19: qty 1

## 2012-10-19 MED ORDER — LACTATED RINGERS IV SOLN
500.0000 mL | INTRAVENOUS | Status: DC | PRN
  Administered 2012-10-20: 500 mL via INTRAVENOUS

## 2012-10-19 MED ORDER — OXYTOCIN 40 UNITS IN LACTATED RINGERS INFUSION - SIMPLE MED
62.5000 mL/h | INTRAVENOUS | Status: DC
  Administered 2012-10-20: 500 mL/h via INTRAVENOUS
  Filled 2012-10-19: qty 1000

## 2012-10-19 MED ORDER — LIDOCAINE HCL (PF) 1 % IJ SOLN
30.0000 mL | INTRAMUSCULAR | Status: DC | PRN
  Administered 2012-10-20: 30 mL via SUBCUTANEOUS
  Filled 2012-10-19: qty 30

## 2012-10-19 MED ORDER — ONDANSETRON HCL 4 MG/2ML IJ SOLN
4.0000 mg | Freq: Four times a day (QID) | INTRAMUSCULAR | Status: DC | PRN
  Administered 2012-10-20: 4 mg via INTRAVENOUS
  Filled 2012-10-19: qty 2

## 2012-10-19 MED ORDER — IBUPROFEN 600 MG PO TABS
600.0000 mg | ORAL_TABLET | Freq: Four times a day (QID) | ORAL | Status: DC | PRN
  Filled 2012-10-19: qty 1

## 2012-10-19 MED ORDER — CITRIC ACID-SODIUM CITRATE 334-500 MG/5ML PO SOLN: 30.0000 mL | ORAL | Status: DC | PRN

## 2012-10-19 MED ORDER — OXYTOCIN BOLUS FROM INFUSION: 500.0000 mL | INTRAVENOUS | Status: DC

## 2012-10-19 NOTE — H&P (Signed)
26 y.o. G1P0  Estimated Date of Delivery: 10/16/12 admitted at 40/[redacted] weeks gestation induction.  Prenatal Transfer Tool  Maternal Diabetes: No Genetic Screening: Normal Maternal Ultrasounds/Referrals: Normal Fetal Ultrasounds or other Referrals:  None Maternal Substance Abuse:  No Significant Maternal Medications:  None Significant Maternal Lab Results: Lab values include: Rh negative (FOB Rh negative as well). Other Significant Pregnancy Complications:  None  Afebrile, VSS Heart and Lungs: No active disease Abdomen: soft, gravid, EFW AGA. Cervical exam:  1/60  Impression: Post dates pregnancy  Plan:  Cytotec/Pitocin induction

## 2012-10-20 ENCOUNTER — Encounter (HOSPITAL_COMMUNITY): Payer: Self-pay | Admitting: Anesthesiology

## 2012-10-20 ENCOUNTER — Inpatient Hospital Stay (HOSPITAL_COMMUNITY): Payer: BC Managed Care – PPO | Admitting: Anesthesiology

## 2012-10-20 ENCOUNTER — Encounter (HOSPITAL_COMMUNITY): Payer: Self-pay

## 2012-10-20 LAB — RPR: RPR Ser Ql: NONREACTIVE

## 2012-10-20 MED ORDER — BENZOCAINE-MENTHOL 20-0.5 % EX AERO
1.0000 "application " | INHALATION_SPRAY | CUTANEOUS | Status: DC | PRN
  Administered 2012-10-21: 1 via TOPICAL
  Filled 2012-10-20 (×2): qty 56

## 2012-10-20 MED ORDER — DIPHENHYDRAMINE HCL 50 MG/ML IJ SOLN: 12.5000 mg | INTRAMUSCULAR | Status: DC | PRN

## 2012-10-20 MED ORDER — METHYLERGONOVINE MALEATE 0.2 MG/ML IJ SOLN: 0.2000 mg | INTRAMUSCULAR | Status: DC | PRN

## 2012-10-20 MED ORDER — SENNOSIDES-DOCUSATE SODIUM 8.6-50 MG PO TABS
2.0000 | ORAL_TABLET | Freq: Every day | ORAL | Status: DC
  Administered 2012-10-20 – 2012-10-21 (×2): 2 via ORAL

## 2012-10-20 MED ORDER — LACTATED RINGERS IV SOLN: 500.0000 mL | Freq: Once | INTRAVENOUS | Status: DC

## 2012-10-20 MED ORDER — SIMETHICONE 80 MG PO CHEW: 80.0000 mg | CHEWABLE_TABLET | ORAL | Status: DC | PRN

## 2012-10-20 MED ORDER — IBUPROFEN 600 MG PO TABS
600.0000 mg | ORAL_TABLET | Freq: Four times a day (QID) | ORAL | Status: DC
  Administered 2012-10-20 – 2012-10-22 (×7): 600 mg via ORAL
  Filled 2012-10-20 (×6): qty 1

## 2012-10-20 MED ORDER — DIBUCAINE 1 % RE OINT: 1.0000 "application " | TOPICAL_OINTMENT | RECTAL | Status: DC | PRN

## 2012-10-20 MED ORDER — TETANUS-DIPHTH-ACELL PERTUSSIS 5-2.5-18.5 LF-MCG/0.5 IM SUSP
0.5000 mL | Freq: Once | INTRAMUSCULAR | Status: DC
  Filled 2012-10-20: qty 0.5

## 2012-10-20 MED ORDER — ONDANSETRON HCL 4 MG PO TABS: 4.0000 mg | ORAL_TABLET | ORAL | Status: DC | PRN

## 2012-10-20 MED ORDER — PRENATAL MULTIVITAMIN CH
1.0000 | ORAL_TABLET | Freq: Every day | ORAL | Status: DC
  Administered 2012-10-21 – 2012-10-22 (×2): 1 via ORAL
  Filled 2012-10-20 (×2): qty 1

## 2012-10-20 MED ORDER — EPHEDRINE 5 MG/ML INJ: 10.0000 mg | INTRAVENOUS | Status: DC | PRN

## 2012-10-20 MED ORDER — EPHEDRINE 5 MG/ML INJ
10.0000 mg | INTRAVENOUS | Status: DC | PRN
  Filled 2012-10-20: qty 4

## 2012-10-20 MED ORDER — LANOLIN HYDROUS EX OINT: TOPICAL_OINTMENT | CUTANEOUS | Status: DC | PRN

## 2012-10-20 MED ORDER — PHENYLEPHRINE 40 MCG/ML (10ML) SYRINGE FOR IV PUSH (FOR BLOOD PRESSURE SUPPORT)
80.0000 ug | PREFILLED_SYRINGE | INTRAVENOUS | Status: DC | PRN
  Administered 2012-10-20: 200 ug via INTRAVENOUS

## 2012-10-20 MED ORDER — HYDROMORPHONE HCL 2 MG PO TABS
2.0000 mg | ORAL_TABLET | ORAL | Status: DC | PRN
  Administered 2012-10-20: 2 mg via ORAL
  Filled 2012-10-20 (×2): qty 1

## 2012-10-20 MED ORDER — METHYLERGONOVINE MALEATE 0.2 MG PO TABS: 0.2000 mg | ORAL_TABLET | ORAL | Status: DC | PRN

## 2012-10-20 MED ORDER — PHENYLEPHRINE 40 MCG/ML (10ML) SYRINGE FOR IV PUSH (FOR BLOOD PRESSURE SUPPORT)
80.0000 ug | PREFILLED_SYRINGE | INTRAVENOUS | Status: DC | PRN
  Filled 2012-10-20: qty 5

## 2012-10-20 MED ORDER — FENTANYL 2.5 MCG/ML BUPIVACAINE 1/10 % EPIDURAL INFUSION (WH - ANES)
14.0000 mL/h | INTRAMUSCULAR | Status: DC
  Administered 2012-10-20: 14 mL/h via EPIDURAL
  Filled 2012-10-20: qty 125

## 2012-10-20 MED ORDER — DOCUSATE SODIUM 100 MG PO CAPS
100.0000 mg | ORAL_CAPSULE | Freq: Two times a day (BID) | ORAL | Status: DC
  Administered 2012-10-20 – 2012-10-21 (×3): 100 mg via ORAL
  Filled 2012-10-20 (×3): qty 1

## 2012-10-20 MED ORDER — ONDANSETRON HCL 4 MG/2ML IJ SOLN: 4.0000 mg | INTRAMUSCULAR | Status: DC | PRN

## 2012-10-20 MED ORDER — DIPHENHYDRAMINE HCL 25 MG PO CAPS: 25.0000 mg | ORAL_CAPSULE | Freq: Four times a day (QID) | ORAL | Status: DC | PRN

## 2012-10-20 MED ORDER — SODIUM BICARBONATE 8.4 % IV SOLN
INTRAVENOUS | Status: DC | PRN
  Administered 2012-10-20: 5 mL via EPIDURAL

## 2012-10-20 MED ORDER — WITCH HAZEL-GLYCERIN EX PADS: 1.0000 "application " | MEDICATED_PAD | CUTANEOUS | Status: DC | PRN

## 2012-10-20 MED ORDER — ZOLPIDEM TARTRATE 5 MG PO TABS: 5.0000 mg | ORAL_TABLET | Freq: Every evening | ORAL | Status: DC | PRN

## 2012-10-20 NOTE — Progress Notes (Signed)
Patient ID: Kathryn Roberts, female   DOB: 1986/02/21, 26 y.o.   MRN: [id number]   S: More comfortable after epidural O:  Filed Vitals:   10/20/12 0817 10/20/12 0820 10/20/12 0823 10/20/12 0832  BP: 114/70 126/64 115/70 116/58  Pulse: 70 75 78 69  Temp:      TempSrc:      Resp:      Height:      Weight:       FHT 110-115 reactive Cvx 3-4/80/-1 toco q3-5 min  AROM clear fluid  A/P  1) Cont induction 2) FWB reassuring

## 2012-10-20 NOTE — Anesthesia Procedure Notes (Signed)

## 2012-10-20 NOTE — Anesthesia Preprocedure Evaluation (Signed)

## 2012-10-21 LAB — CBC
HCT: 27.8 % — ABNORMAL LOW (ref 36.0–46.0)
Hemoglobin: 9.5 g/dL — ABNORMAL LOW (ref 12.0–15.0)
MCH: 33.1 pg (ref 26.0–34.0)
MCV: 96.9 fL (ref 78.0–100.0)
RBC: 2.87 MIL/uL — ABNORMAL LOW (ref 3.87–5.11)
WBC: 13.7 10*3/uL — ABNORMAL HIGH (ref 4.0–10.5)

## 2012-10-21 NOTE — Progress Notes (Signed)
Post Partum Day 1 Subjective: Perirectal pain  Objective: Blood pressure 118/73, pulse 90, temperature 98 F (36.7 C), temperature source Oral, resp. rate 18, height 5\' 4"  (1.626 m), weight 180 lb (81.647 kg), last menstrual period 01/10/2012, SpO2 100.00%,  breastfeeding.  Physical Exam:  General: alert Lochia: appropriate Uterine Fundus: firm Perineum not inflamed, no hematoma.  Perirectal eccemosis - symmetrical.  No induration or inflammation.  Rectal exam without hematoma.  Rectal mucosa intact.   Basename 10/21/12 0525 10/19/12 2030  HGB 9.5* 11.1*  HCT 27.8* 32.3*    Assessment/Plan: Plan for discharge tomorrow, Sitz baths, Circum-anal echemosis consistent with external sphincter repair.   LOS: 2 days   Therisa Mennella D 10/21/2012, 10:18 AM

## 2012-10-22 MED ORDER — DIBUCAINE 1 % RE OINT: 1.0000 "application " | TOPICAL_OINTMENT | RECTAL | Status: DC | PRN

## 2012-10-22 MED ORDER — IBUPROFEN 600 MG PO TABS: 600.0000 mg | ORAL_TABLET | Freq: Four times a day (QID) | ORAL | Status: DC | PRN

## 2012-10-22 MED ORDER — DOCUSATE SODIUM 100 MG PO CAPS: 100.0000 mg | ORAL_CAPSULE | Freq: Two times a day (BID) | ORAL | Status: DC

## 2012-10-22 MED ORDER — POLYETHYLENE GLYCOL 3350 17 GM/SCOOP PO POWD: 17.0000 g | Freq: Every day | ORAL | Status: DC

## 2012-10-22 NOTE — Addendum Note (Signed)
Addendum  created 10/22/12 1029 by Jiles Garter, MD   Modules edited:Notes Section

## 2012-10-22 NOTE — Addendum Note (Signed)
Addendum  created 10/22/12 1030 by Jiles Garter, MD   Modules edited:Notes Section

## 2012-10-22 NOTE — Anesthesia Postprocedure Evaluation (Signed)
  Anesthesia Post-op Note  Patient: Kathryn Roberts This patient has recovered from her labor epidural, and I am not aware of any complications or problems.

## 2012-10-22 NOTE — Discharge Summary (Signed)
Obstetric Discharge Summary Reason for Admission: induction of labor Prenatal Procedures: NST and ultrasound Intrapartum Procedures: vacuum and 3rd degree perineal laceration Postpartum Procedures: none Complications-Operative and Postpartum: 3rd degree perineal laceration Hemoglobin  Date Value Range Status  10/21/2012 9.5* 12.0 - 15.0 g/dL Final     HCT  Date Value Range Status  10/21/2012 27.8* 36.0 - 46.0 % Final    Physical Exam:  General: alert, cooperative and appears stated age 26: appropriate Uterine Fundus: firm Incision: healing well DVT Evaluation: No evidence of DVT seen on physical exam.  Discharge Diagnoses: Term Pregnancy-delivered  Discharge Information: Date: 10/22/2012 Activity: pelvic rest Diet: routine Medications: Ibuprofen, Colace and dilaudid Condition: improved Instructions: refer to practice specific booklet Discharge to: home Follow-up Information    Follow up with Almon Hercules., MD. In 4 weeks. (For a postpartum evaluation)    Contact information:   [address] [place] [address] [phone]          Newborn Data: Live born female  Birth Weight: 7 lb 12.7 oz (3535 g) APGAR: 8, 9  Home with mother.  Kathryn Roberts H. 10/22/2012, 8:46 AM

## 2012-12-15 ENCOUNTER — Encounter: Payer: Self-pay | Admitting: Internal Medicine

## 2014-06-08 ENCOUNTER — Other Ambulatory Visit: Payer: Self-pay | Admitting: Obstetrics and Gynecology

## 2014-06-09 LAB — CYTOLOGY - PAP

## 2014-06-13 ENCOUNTER — Encounter: Payer: Self-pay | Admitting: Internal Medicine

## 2014-07-07 ENCOUNTER — Other Ambulatory Visit: Payer: Self-pay | Admitting: Urology

## 2014-08-29 ENCOUNTER — Encounter (HOSPITAL_COMMUNITY): Payer: Self-pay

## 2014-10-04 ENCOUNTER — Encounter (HOSPITAL_BASED_OUTPATIENT_CLINIC_OR_DEPARTMENT_OTHER): Payer: Self-pay | Admitting: *Deleted

## 2014-10-05 ENCOUNTER — Encounter (HOSPITAL_BASED_OUTPATIENT_CLINIC_OR_DEPARTMENT_OTHER): Payer: Self-pay | Admitting: *Deleted

## 2014-10-06 ENCOUNTER — Encounter (HOSPITAL_BASED_OUTPATIENT_CLINIC_OR_DEPARTMENT_OTHER): Payer: Self-pay | Admitting: *Deleted

## 2014-10-06 NOTE — Progress Notes (Signed)
NPO AFTER MN. ARRIVE AT 0715. NEEDS HG AND URINE PREG.

## 2014-10-07 ENCOUNTER — Other Ambulatory Visit: Payer: Self-pay | Admitting: Urology

## 2014-10-07 NOTE — Anesthesia Preprocedure Evaluation (Addendum)
Anesthesia Evaluation  Patient identified by MRN, date of birth, ID band Patient awake    Reviewed: Allergy & Precautions, H&P , NPO status , Patient's Chart, lab work & pertinent test results  History of Anesthesia Complications (+) PONV and history of anesthetic complications  Airway Mallampati: II  TM Distance: >3 FB Neck ROM: Full    Dental no notable dental hx. (+) Dental Advisory Given   Pulmonary neg pulmonary ROS,  breath sounds clear to auscultation  Pulmonary exam normal       Cardiovascular negative cardio ROS  Rhythm:Regular Rate:Normal     Neuro/Psych PSYCHIATRIC DISORDERS (ADD) Anxiety negative neurological ROS     GI/Hepatic Neg liver ROS, GERD-  Medicated and Controlled,  Endo/Other  negative endocrine ROS  Renal/GU negative Renal ROS  negative genitourinary   Musculoskeletal negative musculoskeletal ROS (+)   Abdominal   Peds negative pediatric ROS (+)  Hematology negative hematology ROS (+)   Anesthesia Other Findings   Reproductive/Obstetrics negative OB ROS                            Anesthesia Physical Anesthesia Plan  ASA: II  Anesthesia Plan: General   Post-op Pain Management:    Induction: Intravenous  Airway Management Planned: Oral ETT  Additional Equipment:   Intra-op Plan:   Post-operative Plan: Extubation in OR  Informed Consent: I have reviewed the patients History and Physical, chart, labs and discussed the procedure including the risks, benefits and alternatives for the proposed anesthesia with the patient or authorized representative who has indicated his/her understanding and acceptance.   Dental advisory given  Plan Discussed with: CRNA  Anesthesia Plan Comments:       Anesthesia Quick Evaluation

## 2014-10-10 ENCOUNTER — Encounter (HOSPITAL_BASED_OUTPATIENT_CLINIC_OR_DEPARTMENT_OTHER): Payer: Self-pay | Admitting: Anesthesiology

## 2014-10-10 ENCOUNTER — Ambulatory Visit (HOSPITAL_BASED_OUTPATIENT_CLINIC_OR_DEPARTMENT_OTHER): Payer: BC Managed Care – PPO | Admitting: Anesthesiology

## 2014-10-10 ENCOUNTER — Ambulatory Visit (HOSPITAL_BASED_OUTPATIENT_CLINIC_OR_DEPARTMENT_OTHER)
Admission: RE | Admit: 2014-10-10 | Discharge: 2014-10-11 | Disposition: A | Discharge status: Home / Self Care | Payer: BC Managed Care – PPO | Source: Ambulatory Visit | Attending: Urology | Admitting: Urology

## 2014-10-10 ENCOUNTER — Encounter (HOSPITAL_BASED_OUTPATIENT_CLINIC_OR_DEPARTMENT_OTHER)
Admission: RE | Disposition: A | Discharge status: Home / Self Care | Payer: Self-pay | Source: Ambulatory Visit | Attending: Urology

## 2014-10-10 DIAGNOSIS — K219 Gastro-esophageal reflux disease without esophagitis: Secondary | ICD-10-CM | POA: Diagnosis not present

## 2014-10-10 DIAGNOSIS — N393 Stress incontinence (female) (male): Secondary | ICD-10-CM | POA: Diagnosis present

## 2014-10-10 HISTORY — DX: Stress incontinence (female) (male): N39.3

## 2014-10-10 HISTORY — DX: Other specified postprocedural states: R11.2

## 2014-10-10 HISTORY — DX: Other specified behavioral and emotional disorders with onset usually occurring in childhood and adolescence: F98.8

## 2014-10-10 HISTORY — DX: Gastro-esophageal reflux disease without esophagitis: K21.9

## 2014-10-10 HISTORY — DX: Personal history of colonic polyps: Z86.010

## 2014-10-10 HISTORY — PX: PUBOVAGINAL SLING: SHX1035

## 2014-10-10 HISTORY — DX: Other specified postprocedural states: Z98.890

## 2014-10-10 HISTORY — DX: Personal history of other diseases of the digestive system: Z87.19

## 2014-10-10 HISTORY — DX: Family history of malignant neoplasm of digestive organs: Z80.0

## 2014-10-10 HISTORY — DX: Personal history of peptic ulcer disease: Z87.11

## 2014-10-10 HISTORY — DX: Personal history of adenomatous and serrated colon polyps: Z86.0101

## 2014-10-10 LAB — POCT HEMOGLOBIN-HEMACUE: Hemoglobin: 12.5 g/dL (ref 12.0–15.0)

## 2014-10-10 LAB — POCT PREGNANCY, URINE: Preg Test, Ur: NEGATIVE

## 2014-10-10 SURGERY — CREATION, PUBOVAGINAL SLING
Anesthesia: General | Site: Cervix

## 2014-10-10 MED ORDER — KETOROLAC TROMETHAMINE 30 MG/ML IJ SOLN
INTRAMUSCULAR | Status: AC
  Filled 2014-10-10: qty 1

## 2014-10-10 MED ORDER — NEOSTIGMINE METHYLSULFATE 10 MG/10ML IV SOLN
INTRAVENOUS | Status: DC | PRN
  Administered 2014-10-10: 4 mg via INTRAVENOUS

## 2014-10-10 MED ORDER — ACETAMINOPHEN 325 MG PO TABS
650.0000 mg | ORAL_TABLET | ORAL | Status: DC | PRN
  Filled 2014-10-10: qty 2

## 2014-10-10 MED ORDER — ROCURONIUM BROMIDE 100 MG/10ML IV SOLN
INTRAVENOUS | Status: DC | PRN
  Administered 2014-10-10: 10 mg via INTRAVENOUS
  Administered 2014-10-10: 25 mg via INTRAVENOUS
  Administered 2014-10-10: 5 mg via INTRAVENOUS

## 2014-10-10 MED ORDER — CIPROFLOXACIN HCL 500 MG PO TABS
500.0000 mg | ORAL_TABLET | Freq: Two times a day (BID) | ORAL | Status: DC
  Administered 2014-10-10 (×2): 500 mg via ORAL
  Filled 2014-10-10: qty 1

## 2014-10-10 MED ORDER — SODIUM CHLORIDE 0.45 % IV SOLN
INTRAVENOUS | Status: DC
  Administered 2014-10-10 – 2014-10-11 (×2): via INTRAVENOUS
  Filled 2014-10-10: qty 1000

## 2014-10-10 MED ORDER — SENNOSIDES-DOCUSATE SODIUM 8.6-50 MG PO TABS
2.0000 | ORAL_TABLET | Freq: Every day | ORAL | Status: DC
  Administered 2014-10-10: 2 via ORAL
  Filled 2014-10-10: qty 2

## 2014-10-10 MED ORDER — BUPIVACAINE-EPINEPHRINE 0.5% -1:200000 IJ SOLN
INTRAMUSCULAR | Status: DC | PRN
  Administered 2014-10-10: 20 mL

## 2014-10-10 MED ORDER — FENTANYL CITRATE 0.05 MG/ML IJ SOLN
INTRAMUSCULAR | Status: DC | PRN
  Administered 2014-10-10 (×2): 50 ug via INTRAVENOUS
  Administered 2014-10-10: 100 ug via INTRAVENOUS

## 2014-10-10 MED ORDER — CEFAZOLIN SODIUM-DEXTROSE 2-3 GM-% IV SOLR
INTRAVENOUS | Status: DC | PRN
  Administered 2014-10-10: 15 g via INTRAVENOUS

## 2014-10-10 MED ORDER — PROPOFOL 10 MG/ML IV BOLUS
INTRAVENOUS | Status: DC | PRN
  Administered 2014-10-10: 160 mg via INTRAVENOUS
  Administered 2014-10-10: 40 mg via INTRAVENOUS

## 2014-10-10 MED ORDER — MIDAZOLAM HCL 5 MG/5ML IJ SOLN
INTRAMUSCULAR | Status: DC | PRN
  Administered 2014-10-10: 2 mg via INTRAVENOUS

## 2014-10-10 MED ORDER — LACTATED RINGERS IV SOLN
INTRAVENOUS | Status: DC
  Administered 2014-10-10 (×2): via INTRAVENOUS
  Filled 2014-10-10: qty 1000

## 2014-10-10 MED ORDER — DEXAMETHASONE SODIUM PHOSPHATE 4 MG/ML IJ SOLN
INTRAMUSCULAR | Status: DC | PRN
  Administered 2014-10-10: 10 mg via INTRAVENOUS

## 2014-10-10 MED ORDER — CIPROFLOXACIN HCL 500 MG PO TABS
ORAL_TABLET | ORAL | Status: AC
  Filled 2014-10-10: qty 1

## 2014-10-10 MED ORDER — ZOLPIDEM TARTRATE 5 MG PO TABS
5.0000 mg | ORAL_TABLET | Freq: Every evening | ORAL | Status: DC | PRN
  Administered 2014-10-10: 5 mg via ORAL
  Filled 2014-10-10: qty 1

## 2014-10-10 MED ORDER — LIDOCAINE HCL (CARDIAC) 20 MG/ML IV SOLN
INTRAVENOUS | Status: DC | PRN
  Administered 2014-10-10: 80 mg via INTRAVENOUS

## 2014-10-10 MED ORDER — SUCCINYLCHOLINE CHLORIDE 20 MG/ML IJ SOLN
INTRAMUSCULAR | Status: DC | PRN
  Administered 2014-10-10: 100 mg via INTRAVENOUS

## 2014-10-10 MED ORDER — BACITRACIN-NEOMYCIN-POLYMYXIN 400-5-5000 EX OINT
1.0000 "application " | TOPICAL_OINTMENT | Freq: Three times a day (TID) | CUTANEOUS | Status: DC | PRN
  Filled 2014-10-10: qty 1

## 2014-10-10 MED ORDER — METOCLOPRAMIDE HCL 5 MG/ML IJ SOLN
INTRAMUSCULAR | Status: DC | PRN
  Administered 2014-10-10: 10 mg via INTRAVENOUS

## 2014-10-10 MED ORDER — FENTANYL CITRATE 0.05 MG/ML IJ SOLN
INTRAMUSCULAR | Status: AC
  Filled 2014-10-10: qty 6

## 2014-10-10 MED ORDER — KETOROLAC TROMETHAMINE 30 MG/ML IJ SOLN
30.0000 mg | Freq: Four times a day (QID) | INTRAMUSCULAR | Status: DC
  Administered 2014-10-10 – 2014-10-11 (×3): 30 mg via INTRAVENOUS
  Filled 2014-10-10: qty 1

## 2014-10-10 MED ORDER — MIDAZOLAM HCL 2 MG/2ML IJ SOLN
INTRAMUSCULAR | Status: AC
  Filled 2014-10-10: qty 2

## 2014-10-10 MED ORDER — ACETAMINOPHEN 10 MG/ML IV SOLN
INTRAVENOUS | Status: DC | PRN
  Administered 2014-10-10: 1000 mg via INTRAVENOUS

## 2014-10-10 MED ORDER — STERILE WATER FOR IRRIGATION IR SOLN
Status: DC | PRN
  Administered 2014-10-10: 3000 mL via INTRAVESICAL

## 2014-10-10 MED ORDER — CEFAZOLIN SODIUM 1-5 GM-% IV SOLN
1.0000 g | INTRAVENOUS | Status: DC
  Filled 2014-10-10: qty 50

## 2014-10-10 MED ORDER — SENNA 8.6 MG PO TABS
ORAL_TABLET | ORAL | Status: AC
  Filled 2014-10-10: qty 1

## 2014-10-10 MED ORDER — ONDANSETRON HCL 4 MG/2ML IJ SOLN
4.0000 mg | INTRAMUSCULAR | Status: DC | PRN
  Administered 2014-10-10: 4 mg via INTRAVENOUS
  Filled 2014-10-10: qty 2

## 2014-10-10 MED ORDER — ONDANSETRON HCL 4 MG/2ML IJ SOLN
4.0000 mg | Freq: Once | INTRAMUSCULAR | Status: AC | PRN
  Filled 2014-10-10: qty 2

## 2014-10-10 MED ORDER — ONDANSETRON HCL 4 MG/2ML IJ SOLN
INTRAMUSCULAR | Status: AC
  Filled 2014-10-10: qty 2

## 2014-10-10 MED ORDER — METRONIDAZOLE 0.75 % VA GEL
VAGINAL | Status: DC | PRN
  Administered 2014-10-10: 1 via VAGINAL

## 2014-10-10 MED ORDER — ONDANSETRON HCL 4 MG/2ML IJ SOLN
INTRAMUSCULAR | Status: DC | PRN
  Administered 2014-10-10: 4 mg via INTRAVENOUS

## 2014-10-10 MED ORDER — ZOLPIDEM TARTRATE 5 MG PO TABS
ORAL_TABLET | ORAL | Status: AC
  Filled 2014-10-10: qty 1

## 2014-10-10 MED ORDER — SODIUM CHLORIDE 0.9 % IR SOLN
Status: DC | PRN
  Administered 2014-10-10: 500 mL

## 2014-10-10 MED ORDER — PROPOFOL INFUSION 10 MG/ML OPTIME
INTRAVENOUS | Status: DC | PRN
  Administered 2014-10-10: 50 ug/kg/min via INTRAVENOUS

## 2014-10-10 MED ORDER — CEFAZOLIN SODIUM-DEXTROSE 2-3 GM-% IV SOLR
INTRAVENOUS | Status: AC
  Filled 2014-10-10: qty 50

## 2014-10-10 MED ORDER — FENTANYL CITRATE 0.05 MG/ML IJ SOLN
25.0000 ug | INTRAMUSCULAR | Status: DC | PRN
  Filled 2014-10-10: qty 1

## 2014-10-10 MED ORDER — DIPHENHYDRAMINE HCL 50 MG/ML IJ SOLN
12.5000 mg | Freq: Four times a day (QID) | INTRAMUSCULAR | Status: DC | PRN
  Filled 2014-10-10: qty 0.5

## 2014-10-10 MED ORDER — KETOROLAC TROMETHAMINE 30 MG/ML IJ SOLN
INTRAMUSCULAR | Status: DC | PRN
  Administered 2014-10-10: 30 mg via INTRAVENOUS

## 2014-10-10 MED ORDER — DIPHENHYDRAMINE HCL 12.5 MG/5ML PO ELIX
12.5000 mg | ORAL_SOLUTION | Freq: Four times a day (QID) | ORAL | Status: DC | PRN
  Filled 2014-10-10: qty 10

## 2014-10-10 MED ORDER — GLYCOPYRROLATE 0.2 MG/ML IJ SOLN
INTRAMUSCULAR | Status: DC | PRN
  Administered 2014-10-10: .6 mg via INTRAVENOUS

## 2014-10-10 SURGICAL SUPPLY — 59 items
ADH SKN CLS APL DERMABOND .7 (GAUZE/BANDAGES/DRESSINGS)
BAG URINE DRAINAGE (UROLOGICAL SUPPLIES) ×2 IMPLANT
BLADE CLIPPER SURG (BLADE) ×2 IMPLANT
BLADE SURG 10 STRL SS (BLADE) ×2 IMPLANT
BLADE SURG 15 STRL LF DISP TIS (BLADE) ×1 IMPLANT
BLADE SURG 15 STRL SS (BLADE) ×2
BOOTIES KNEE HIGH SLOAN (MISCELLANEOUS) ×1 IMPLANT
CANISTER SUCTION 1200CC (MISCELLANEOUS) IMPLANT
CANISTER SUCTION 2500CC (MISCELLANEOUS) ×4 IMPLANT
CATH FOLEY 2WAY SLVR  5CC 16FR (CATHETERS) ×1
CATH FOLEY 2WAY SLVR 5CC 16FR (CATHETERS) ×1 IMPLANT
COVER MAYO STAND STRL (DRAPES) ×2 IMPLANT
COVER TABLE BACK 60X90 (DRAPES) ×2 IMPLANT
DERMABOND ADVANCED (GAUZE/BANDAGES/DRESSINGS)
DERMABOND ADVANCED .7 DNX12 (GAUZE/BANDAGES/DRESSINGS) IMPLANT
DEVICE CAPIO SLIM BOX (INSTRUMENTS) IMPLANT
DISSECTOR ROUND CHERRY 3/8 STR (MISCELLANEOUS) IMPLANT
DRAPE CAMERA CLOSED 9X96 (DRAPES) ×2 IMPLANT
DRAPE UNDERBUTTOCKS STRL (DRAPE) ×2 IMPLANT
FLOSEAL 10ML (HEMOSTASIS) IMPLANT
GAUZE SPONGE 4X4 16PLY XRAY LF (GAUZE/BANDAGES/DRESSINGS) IMPLANT
GLOVE BIO SURGEON STRL SZ 6.5 (GLOVE) ×1 IMPLANT
GLOVE BIO SURGEON STRL SZ7.5 (GLOVE) ×2 IMPLANT
GLOVE INDICATOR 6.5 STRL GRN (GLOVE) ×2 IMPLANT
GOWN PREVENTION PLUS LG XLONG (DISPOSABLE) IMPLANT
GOWN STRL REIN XL XLG (GOWN DISPOSABLE) ×1 IMPLANT
GOWN STRL REUS W/ TWL LRG LVL3 (GOWN DISPOSABLE) IMPLANT
GOWN STRL REUS W/TWL LRG LVL3 (GOWN DISPOSABLE) ×4 IMPLANT
NEEDLE 1/2 CIR CATGUT .05X1.09 (NEEDLE) IMPLANT
NEEDLE HYPO 22GX1.5 SAFETY (NEEDLE) ×4 IMPLANT
PACKING VAGINAL (PACKING) ×2 IMPLANT
PENCIL BUTTON HOLSTER BLD 10FT (ELECTRODE) ×2 IMPLANT
PLUG CATH AND CAP STER (CATHETERS) ×2 IMPLANT
RETRACTOR LONRSTAR 16.6X16.6CM (MISCELLANEOUS) ×1 IMPLANT
RETRACTOR STAY HOOK 5MM (MISCELLANEOUS) ×2 IMPLANT
RETRACTOR STER APS 16.6X16.6CM (MISCELLANEOUS) ×2
SET IRRIG Y TYPE TUR BLADDER L (SET/KITS/TRAYS/PACK) ×2 IMPLANT
SHEET LAVH (DRAPES) ×2 IMPLANT
SLING LYNX SUPRAPUBIC (Sling) ×1 IMPLANT
SLING SOLYX SYSTEM SIS BX (SLING) IMPLANT
SPONGE LAP 4X18 X RAY DECT (DISPOSABLE) ×2 IMPLANT
SUCTION FRAZIER TIP 10 FR DISP (SUCTIONS) ×2 IMPLANT
SUT ABS MONO DBL WITH NDL 48IN (SUTURE) IMPLANT
SUT ETHILON 2 0 PS N (SUTURE) IMPLANT
SUT MON AB 2-0 SH 27 (SUTURE)
SUT MON AB 2-0 SH27 (SUTURE) IMPLANT
SUT NONABSORB MONO DB W/NDL 48 (SUTURE) IMPLANT
SUT PDS AB 3-0 SH 27 (SUTURE) IMPLANT
SUT VIC AB 0 CT1 36 (SUTURE) IMPLANT
SUT VIC AB 2-0 CT1 27 (SUTURE)
SUT VIC AB 2-0 CT1 TAPERPNT 27 (SUTURE) IMPLANT
SUT VIC AB 2-0 UR6 27 (SUTURE) ×2 IMPLANT
SYR BULB IRRIGATION 50ML (SYRINGE) ×2 IMPLANT
SYR CONTROL 10ML LL (SYRINGE) ×2 IMPLANT
SYRINGE 10CC LL (SYRINGE) ×2 IMPLANT
TRAY DSU PREP LF (CUSTOM PROCEDURE TRAY) ×2 IMPLANT
TUBE CONNECTING 12X1/4 (SUCTIONS) ×4 IMPLANT
WATER STERILE IRR 500ML POUR (IV SOLUTION) ×3 IMPLANT
YANKAUER SUCT BULB TIP NO VENT (SUCTIONS) IMPLANT

## 2014-10-10 NOTE — H&P (Signed)
Reason For Visit Incontinence   History of Present Illness 28 yo female, last seen 10/15/05 (former patient of Dr. Alan Ripper), referred back by Dr. Helane Rima for further evaluation of incontinence. Kathryn Roberts has a 26 month old female, and had 3rd degree laceration during delivery .She has stress incontinence, and cannot run, jump, or work-out.   Past Medical History Problems  1. History of Anxiety (300.00) 2. History of gastric ulcer (V12.79)  Surgical History Problems  1. History of Cholecystectomy 2. History of Colonoscopy (Fiberoptic)  Current Meds 1. Wellbutrin SR TBCR;  Therapy: (Recorded:08Sep2015) to Recorded  Allergies Medication  1. No Known Drug Allergies  Family History Problems  1. Family history of diabetes mellitus (V18.0) 2. Family history of kidney stones (V18.69) : Mother, Father  Social History Problems    Alcohol use (V49.89)   Caffeine use (V49.89)   Married   Never a smoker   Occupation   One child  Review of Systems  Genitourinary: incontinence.    Vitals Vital Signs [Data Includes: Last 1 Day]  Recorded: 08Sep2015 01:11PM  Height: 5 ft 4 in Weight: 143 lb  BMI Calculated: 24.55 BSA Calculated: 1.7 Blood Pressure: 98 / 68 Temperature: 99.3 F Heart Rate: 69  Physical Exam Constitutional: Well nourished and well developed . No acute distress.  ENT:. The ears and nose are normal in appearance.  Neck: The appearance of the neck is normal and no neck mass is present.  Pulmonary: No respiratory distress and normal respiratory rhythm and effort.  Cardiovascular: Heart rate and rhythm are normal.  Abdomen: The abdomen is soft and nontender. No masses are palpated. No CVA tenderness. No hernias are palpable. No hepatosplenomegaly noted.  Genitourinary:  Chaperone Present: laura.  Examination of the external genitalia shows normal female external genitalia and no lesions. The urethra is normal in appearance, not tender and no urethral  caruncle. There is no urethral mass. Urethral hypermobility is present. There is no urethral discharge. There is no urethral prolapse. Vaginal exam demonstrates no abnormalities, no discharge, no tenderness, the vaginal epithelium to be well estrogenized and no uterine prolapse. No cystocele is identified. No enterocele is identified. No rectocele is identified. The cervix is is without abnormalities. The uterus is without abnormalities. The adnexa are palpably normal. The bladder is normal on palpation, non tender and not distended. The anus is normal on inspection. The perineum is normal on inspection.  Lymphatics: The femoral and inguinal nodes are not enlarged or tender.  Skin: Normal skin turgor, no visible rash and no visible skin lesions.  Neuro/Psych:. Mood and affect are appropriate.    Results/Data Urine [Data Includes: Last 1 Day]   [id number]  COLOR YELLOW   APPEARANCE CLEAR   SPECIFIC GRAVITY 1.020   pH 6.0   GLUCOSE NEG mg/dL  BILIRUBIN NEG   KETONE NEG mg/dL  BLOOD NEG   PROTEIN NEG mg/dL  UROBILINOGEN 0.2 mg/dL  NITRITE NEG   LEUKOCYTE ESTERASE SMALL   SQUAMOUS EPITHELIAL/HPF FEW   WBC 0-2 WBC/hpf  RBC 0-2 RBC/hpf  BACTERIA NONE SEEN   CRYSTALS NONE SEEN   CASTS NONE SEEN    Assessment Assessed  1. Female stress incontinence (625.6)  28 yo female with stress incontinence following delivery, and 3rd degree laceration. she has a + Q-tip test and + Marshall test. We have discussed alternatives of medicines and physical therapy-which she has already done and failed. She is a reasonable candidate for a [place] pubo-vaginal sling, but I am concerned with future pregnancies, and  possibility of need for C-section.    she is on Wellbutrin for her stress ( work).   Plan Female stress incontinence  1. Marshall Test; Status:Hold For - Appointment,Date of Service; Requested  [id number];  2. Follow-up Schedule Surgery Office  Follow-up  Status: Hold For -  Appointment   Requested for: 08Sep2015  [facility] p-v sling.   Out of work for 3 weeks  Wound healing 3-6 weeks.   No sexual activity for 3 weeks.   Discussion/Summary cc: Dian Queen, MD     Signatures

## 2014-10-10 NOTE — Interval H&P Note (Signed)
History and Physical Interval Note:  10/10/2014 8:10 AM  Kathryn Roberts  has presented today for surgery, with the diagnosis of Stress Incontinence  The various methods of treatment have been discussed with the patient and family. After consideration of risks, benefits and other options for treatment, the patient has consented to  Procedure(s): PUBO-VAGINAL SLING (N/A) as a surgical intervention .  The patient's history has been reviewed, patient examined, no change in status, stable for surgery.  I have reviewed the patient's chart and labs.  Questions were answered to the patient's satisfaction.   Cc: Dr. Helane Rima, Dr. Maximino Greenland, [facility]

## 2014-10-10 NOTE — Anesthesia Postprocedure Evaluation (Signed)
  Anesthesia Post-op Note  Patient: Kathryn Roberts  Procedure(s) Performed: Procedure(s) (LRB): PUBO-VAGINAL SLING (N/A)  Patient Location: PACU  Anesthesia Type: General  Level of Consciousness: awake and alert   Airway and Oxygen Therapy: Patient Spontanous Breathing  Post-op Pain: mild  Post-op Assessment: Post-op Vital signs reviewed, Patient's Cardiovascular Status Stable, Respiratory Function Stable, Patent Airway and No signs of Nausea or vomiting  Last Vitals:  Filed Vitals:   10/10/14 1021  BP: 105/54  Pulse:   Temp:   Resp: 14    Post-op Vital Signs: stable   Complications: No apparent anesthesia complications

## 2014-10-10 NOTE — Progress Notes (Signed)
Urology Progress Note  Day of Surgery   Subjective: post op Lynx pubo-vaginal sling. Surgery complicated by scarring around bladder neck, L>R.     No acute urologic events overnight. Ambulation:  yes Flatus:   yes Bowel movement neg  Pain: Good pain relief. Toradol only,   Objective:  Blood pressure 101/62, pulse 65, temperature 98.3 F (36.8 C), temperature source Oral, resp. rate 18, height 5\' 4"  (1.626 m), weight 67.359 kg (148 lb 8 oz), last menstrual period 10/06/2014, SpO2 99 %, not currently breastfeeding.  Physical Exam:  General:  No acute distress, awake ABD: soft, + BS  Genitourinary:  Normal BUS Foley: yes.     I/O last 3 completed shifts: In: 3058 [P.O.:1460; I.V.:1598] Out: 2400 [Urine:2350; Blood:50]  Recent Labs     10/10/14  0814  HGB  12.5    No results for input(s): NA, K, CL, CO2, BUN, CREATININE, CALCIUM, GFRNONAA, GFRAA in the last 72 hours.  Invalid input(s): MAGNESIUM   No results for input(s): INR, APTT in the last 72 hours.  Invalid input(s): PT   Invalid input(s): ABG  Assessment/Plan: Stable tonight. Note some hematuria tonight.  Plan: Continue with plan for foley removal in AM.

## 2014-10-10 NOTE — Op Note (Signed)
Pre-operative diagnosis :   Stress urinary incontinence  Postoperative diagnosis:  Same  Operation:  Kathryn Roberts pubovaginal sling  Surgeon:  S. Gaynelle Arabian, MD  First assistant:  None  Anesthesia:  General LMA  Preparation:  After appropriate preanesthesia, the patient was brought the operating, placed on the operating table in the dorsal supine position where general LMA anesthesia was induced. She was replaced in the dorsal lithotomy position with pubis was prepped with Betadine solution and draped in usual fashion. The midline ureter was marked with a blue marking pen. The bladder neck was marked after Foley catheter placed, with a blue marking pen, and the midline of the pubis was marked with a blue marking pen. In addition, 2 separate areas for superpubic incision were marked with blue marking pen, 2 fingerbreadths above the pubic tubercle and lateral to the pubic tubercle. The history was reviewed. The armband was double checked. Note that the patient suffered vaginal lacerations during vaginal delivery of her child, after which she developed stress and cons. She was noted to have a positive Marshall test and a positive Q-tip test on office examination.  Review history:  of Present Illness 28 yo female, last seen 10/15/05 (former patient of Dr. Alan Ripper), referred back by Dr. Helane Rima for further evaluation of incontinence. Kathryn Roberts has a 7 month old female, and had 3rd degree laceration during delivery .She has stress incontinence, and cannot run, jump, or work-out.   Past Medical History Problems  1. History of Anxiety (300.00) 2. History of gastric ulcer (V12.79)  Surgical History Problems  1. History of Cholecystectomy 2. History of Colonoscopy (Fiberoptic)  Current Meds 1. Wellbutrin SR TBCR; Therapy: (Recorded:08Sep2015) to Recorded  Allergies Medication  1. No Known Drug Allergies  Statement of  Likelihood of Success: Excellent. TIME-OUT observed.:  Procedure:  2  separate  liter incisions were made in the suprapubic area, previously marked. The Longhorn retractor was placed across the vaginal opening, and hooked retractors placed. Foley catheter was secured. Posterior weighted speculum was placed. 10 mL of Marcaine 0.25 with epinephrine 1-250,000 was then injected in the suprapubic incision sites, and, with a separate needle, additional 10 mL of Marcaine and epinephrine was injected in the periurethral vaginal incision site.  A 2 cm midline periurethral incision is made, with care taken to avoid the proximal urethra and bladder neck area. Subcutaneous tissue was dissected with the Strully scissors, to the level of the pubis bilaterally. With the bladder drained, and the patient in Trendelenburg position, the Kathryn Roberts needles were placed bilaterally. Cystoscopy was accomplished with the 12 lens, the 30 lens, and the 70 lens. It was felt that the needles were too close to the mucosa of the bladder, and each needle was replaced. Repeat cystoscopy was accomplished, showing no evidence of mucosal injury, and good movement of the bladder neck.  The Kathryn Roberts mesh was placed, and, with an open right angle clamp behind the midline, the arms of the Kathryn Roberts sling were brought into the retropubic position. The blue plastic Was then removed from the midline, and the plastic sleeves removed. Tensioning was accomplished with a open right angle clamp constantly behind the midline of the mesh. The mesh lay loose, and smooth, and flat against the urethra. At the robotic irrigation was used to irrigate the urethral and the superpubic wounds. The vaginal incision was closed with running 2-0 Vicryl suture. The superpubic wounds were closed with Dermabond. The patient received IV Tylenol and IV Toradol. Because of her acknowledgment of codeine allergy to  anesthesia, the patient did not receive a B&O suppository. She was awakened, and taken to recovery room in good condition.

## 2014-10-10 NOTE — Transfer of Care (Signed)
Immediate Anesthesia Transfer of Care Note  Patient: Kathryn Roberts  Procedure(s) Performed: Procedure(s) (LRB): PUBO-VAGINAL SLING (N/A)  Patient Location: PACU  Anesthesia Type: General  Level of Consciousness: awake, alert  and oriented  Airway & Oxygen Therapy: Patient Spontanous Breathing and Patient connected to face mask oxygen  Post-op Assessment: Report given to PACU RN and Post -op Vital signs reviewed and stable  Post vital signs: Reviewed and stable  Complications: No apparent anesthesia complications

## 2014-10-11 ENCOUNTER — Encounter (HOSPITAL_BASED_OUTPATIENT_CLINIC_OR_DEPARTMENT_OTHER): Payer: Self-pay | Admitting: Urology

## 2014-10-11 DIAGNOSIS — N393 Stress incontinence (female) (male): Secondary | ICD-10-CM | POA: Diagnosis not present

## 2014-10-11 MED ORDER — PHENAZOPYRIDINE HCL 200 MG PO TABS: 200.0000 mg | ORAL_TABLET | Freq: Three times a day (TID) | ORAL | Status: DC | PRN

## 2014-10-11 MED ORDER — TAMSULOSIN HCL 0.4 MG PO CAPS: 0.4000 mg | ORAL_CAPSULE | Freq: Every day | ORAL | Status: DC

## 2014-10-11 MED ORDER — KETOROLAC TROMETHAMINE 30 MG/ML IJ SOLN
INTRAMUSCULAR | Status: AC
  Filled 2014-10-11: qty 1

## 2014-10-11 MED ORDER — MELOXICAM 15 MG PO TABS: 15.0000 mg | ORAL_TABLET | Freq: Every day | ORAL | Status: DC

## 2014-10-11 MED ORDER — TRIMETHOPRIM 100 MG PO TABS: 100.0000 mg | ORAL_TABLET | ORAL | Status: DC

## 2014-10-11 NOTE — Progress Notes (Addendum)
Foley catheter and vaginal packing removed per order.  Urine in foley bag is light pink in color.  Encouraged pt to drink water.  Pt tolerated procedure well.   C/o some pelvic pressure but didn't want to take any more medicine than Toradol that was given earlier.  Husband at bedside

## 2014-10-11 NOTE — Discharge Instructions (Signed)
Urethral Vaginal Sling A urethral vaginal sling procedure is surgery to correct urinary incontinence. Urinary incontinence is uncontrolled loss of urine. It is common in women who have had children and in older women. In this surgery, a strong piece of material is placed under the tube that drains the bladder (urethra). This sling is made of tension-free vaginal tape or nylon mesh. It fits under the urethra like a hammock. The sling is put in position to straighten, support, and hold the urethra in its normal position.  LET [facility] CARE PROVIDER KNOW ABOUT:   Any allergies you have.  All medicines you are taking, including vitamins, herbs, eye drops, creams, and over-the-counter medicines.  Previous problems you or members of your family have had with the use of anesthetics.  Any blood disorders you have.  Previous surgeries you have had.  Medical conditions you have. RISKS AND COMPLICATIONS  Generally, this is a safe procedure. However, as with any procedure, complications can occur. Possible complications include:  Infection.  Excessive bleeding.  Damage to other organs.  Problems urinating properly for several days or weeks.  Problems from the use of anesthetics.  Return of the urinary incontinence. BEFORE THE PROCEDURE   Ask your health care provider about changing or stopping your regular medicines. You may need to stop taking certain medicines 1 week before the surgery.  Do not eat or drink anything for 6-8 hours before the surgery.  If you smoke, do not smoke for at least 2 weeks before the surgery.  Make plans to have someone drive you home after your hospital stay. Also arrange for someone to help you with activities during recovery. PROCEDURE   You will have general or spinal anesthesia. With general anesthesia, you are asleep and will feel no pain. With spinal anesthesia, you are numb from the waist down, but you will still be awake.  A catheter is placed in  your bladder to drain urine during the procedure.  An incision is made in your vagina and low on your belly in the hairline.  The sling material is passed around your bladder neck and sutured to the muscles to hold the urethra in its normal position.  The incisions are closed. AFTER THE PROCEDURE   You will be taken to a recovery area where your progress will be monitored closely. Your breathing, blood pressure, and pulse (vital signs) will be checked often. When you are stable, you will be moved to a regular hospital room.  You will have a catheter in place to drain your bladder. This will stay in place until your bladder is working properly on its own.  You may have a gauze packing in the vagina to prevent bleeding. This will be removed in 1-2 days.  Document Released: 07/23/2008 Document Revised: 08/04/2013 Document Reviewed: 04/02/2013 [facility] Patient Information 2015 [place], [place]. This information is not intended to replace advice given to you by your health care provider. Make sure you discuss any questions you have with your health care provider.

## 2014-10-11 NOTE — Progress Notes (Signed)
Urology Progress Note  1 Day Post-Op   Subjective: Foley and packing out. Pt voided small amount. 450cc residual.     No acute urologic events overnight. Ambulation:   positive Flatus:    positive Bowel movement  negative  Pain: some relief  Objective:  Blood pressure 107/60, pulse 93, temperature 98.5 F (36.9 C), temperature source Oral, resp. rate 18, height 5\' 4"  (1.626 m), weight 67.359 kg (148 lb 8 oz), last menstrual period 10/06/2014, SpO2 100 %, not currently breastfeeding.  Physical Exam:  General:  No acute distress, awake Extremities: extremities normal, atraumatic, no cyanosis or edema Genitourinary:  sull suprapubic area Foley: out    I/O last 3 completed shifts: In: 6213 [P.O.:1700; I.V.:2748] Out: 3975 [YQMVH:8469; Blood:50]  Recent Labs     10/10/14  0814  HGB  12.5    No results for input(s): NA, K, CL, CO2, BUN, CREATININE, CALCIUM, GFRNONAA, GFRAA in the last 72 hours.  Invalid input(s): MAGNESIUM   No results for input(s): INR, APTT in the last 72 hours.  Invalid input(s): PT   Invalid input(s): ABG  Assessment/Plan: Post op urinary retention probably 2ndary edema or bruising. Discussed with pt and husband. She will have foley and RTC in 2 days for voiding trial.   Catheter removed, but will be replaced, and pt will RTC in 2 days for voiding trial.

## 2014-10-11 NOTE — Progress Notes (Signed)
Foley placed per MD order,400cc clear yellow urine returned,patient tolerated well. Will continue to monitor patient. C.Jiovanny Burdell,RN

## 2014-10-11 NOTE — Discharge Summary (Signed)
Physician Discharge Summary  Patient ID: Kathryn Roberts MRN: [id number] DOB/AGE: [birth year] 28 y.o.  Admit date: 10/10/2014 Discharge date: 10/11/2014  Admission Diagnoses: Stress Incontinence  Discharge Diagnoses:  Active Problems:   Stress incontinence in female   Discharged Condition: stable  Hospital Course:  Lynx pubovaginal sling   Significant Diagnostic Studies: none Discharge Exam: Blood pressure 107/60, pulse 93, temperature 98.5 F (36.9 C), temperature source Oral, resp. rate 18, height 5\' 4"  (1.626 m), weight 67.359 kg (148 lb 8 oz), last menstrual period 10/06/2014, SpO2 100 %, not currently breastfeeding.   Disposition: 01-Home or Self Care. Poor voiding pattern at discharge. Therefore, foley catheter replaced, and pt to RTC Thursday morning, 09:30 for voiding trial.   Discharge Instructions    Continue foley catheter    Complete by:  As directed      Discharge patient    Complete by:  As directed      Discontinue IV    Complete by:  As directed             Medication List    TAKE these medications        calcium carbonate 500 MG chewable tablet  Commonly known as:  TUMS - dosed in mg elemental calcium  Chew 1 tablet by mouth as needed for indigestion or heartburn.     meloxicam 15 MG tablet  Commonly known as:  MOBIC  Take 1 tablet (15 mg total) by mouth daily.     phenazopyridine 200 MG tablet  Commonly known as:  PYRIDIUM  Take 1 tablet (200 mg total) by mouth 3 (three) times daily as needed for pain.     tamsulosin 0.4 MG Caps capsule  Commonly known as:  FLOMAX  Take 1 capsule (0.4 mg total) by mouth daily.     trimethoprim 100 MG tablet  Commonly known as:  TRIMPEX  Take 1 tablet (100 mg total) by mouth 1 day or 1 dose.           Follow-up Information    Follow up with Ailene Rud, MD.   Specialty:  Urology   Why:  Thursday morning, 09:30 for voiding trial   Contact information:   [address] [place]  [id number] [phone]       Signed: Carolan Clines I 10/11/2014, 9:03 AM

## 2015-04-05 ENCOUNTER — Ambulatory Visit
Admission: RE | Admit: 2015-04-05 | Discharge: 2015-04-05 | Disposition: A | Discharge status: Home / Self Care | Payer: BLUE CROSS/BLUE SHIELD | Source: Ambulatory Visit | Attending: Nurse Practitioner | Admitting: Nurse Practitioner

## 2015-04-05 ENCOUNTER — Other Ambulatory Visit: Payer: Self-pay | Admitting: Nurse Practitioner

## 2015-04-05 DIAGNOSIS — N6459 Other signs and symptoms in breast: Secondary | ICD-10-CM

## 2015-04-05 DIAGNOSIS — N644 Mastodynia: Secondary | ICD-10-CM

## 2015-04-05 DIAGNOSIS — N6452 Nipple discharge: Secondary | ICD-10-CM

## 2015-10-17 ENCOUNTER — Encounter: Payer: BLUE CROSS/BLUE SHIELD | Admitting: Internal Medicine

## 2016-02-13 ENCOUNTER — Ambulatory Visit (HOSPITAL_COMMUNITY)
Admission: RE | Admit: 2016-02-13 | Discharge: 2016-02-13 | Disposition: A | Discharge status: Home / Self Care | Payer: BLUE CROSS/BLUE SHIELD | Source: Ambulatory Visit | Attending: Registered Nurse | Admitting: Registered Nurse

## 2016-02-13 ENCOUNTER — Other Ambulatory Visit (HOSPITAL_COMMUNITY): Payer: Self-pay | Admitting: Registered Nurse

## 2016-02-13 DIAGNOSIS — R059 Cough, unspecified: Secondary | ICD-10-CM

## 2016-02-13 DIAGNOSIS — Z1389 Encounter for screening for other disorder: Secondary | ICD-10-CM | POA: Diagnosis not present

## 2016-02-13 DIAGNOSIS — Z6826 Body mass index (BMI) 26.0-26.9, adult: Secondary | ICD-10-CM | POA: Diagnosis not present

## 2016-02-13 DIAGNOSIS — R05 Cough: Secondary | ICD-10-CM | POA: Insufficient documentation

## 2016-02-13 DIAGNOSIS — M940 Chondrocostal junction syndrome [Tietze]: Secondary | ICD-10-CM | POA: Insufficient documentation

## 2016-02-13 DIAGNOSIS — J343 Hypertrophy of nasal turbinates: Secondary | ICD-10-CM | POA: Insufficient documentation

## 2016-03-08 ENCOUNTER — Telehealth: Payer: Self-pay

## 2016-03-08 NOTE — Telephone Encounter (Signed)
Called pt to inform her of 5 day diet restrictions since her procedure will be 2 days after her PV.  No ID on machine therefore general message left to call this office.  Angela/PV

## 2016-03-11 ENCOUNTER — Ambulatory Visit (AMBULATORY_SURGERY_CENTER): Payer: Self-pay

## 2016-03-11 ENCOUNTER — Encounter: Payer: Self-pay | Admitting: Internal Medicine

## 2016-03-11 VITALS — Ht 63.0 in | Wt 155.8 lb

## 2016-03-11 DIAGNOSIS — Z860100 Personal history of colon polyps, unspecified: Secondary | ICD-10-CM

## 2016-03-11 NOTE — Progress Notes (Signed)
No allergies to eggs or soy No diet meds No home oxygen No past problems with anesthesia except n/v  Has email and internet; declined emmi

## 2016-03-12 ENCOUNTER — Encounter: Payer: Self-pay | Admitting: Internal Medicine

## 2016-03-13 ENCOUNTER — Encounter: Payer: Self-pay | Admitting: Internal Medicine

## 2016-03-13 ENCOUNTER — Ambulatory Visit (AMBULATORY_SURGERY_CENTER): Payer: BLUE CROSS/BLUE SHIELD | Admitting: Internal Medicine

## 2016-03-13 VITALS — BP 109/62 | HR 58 | Temp 98.4°F | Resp 15 | Ht 63.0 in | Wt 155.0 lb

## 2016-03-13 DIAGNOSIS — Z86010 Personal history of colonic polyps: Secondary | ICD-10-CM | POA: Diagnosis present

## 2016-03-13 DIAGNOSIS — D120 Benign neoplasm of cecum: Secondary | ICD-10-CM

## 2016-03-13 MED ORDER — SODIUM CHLORIDE 0.9 % IV SOLN: 500.0000 mL | INTRAVENOUS | Status: DC

## 2016-03-13 NOTE — Patient Instructions (Addendum)
A 2 mm (TINY!) polyp was removed - all else ok. I will let you know pathology results and when to have another routine colonoscopy by mail. At least 5 years.  For your IBS ok to use loperamide (Imodium) to prevent diarrhea and there are some other medications that could help. A probiotic called VSL # 3 is a good one and also some people get help from using a FODMAPS diet.  Let me know if quality of life is such that you want to try some medication other than above.   I appreciate the opportunity to care for you. Gatha Mayer, MD, FACG     YOU HAD AN ENDOSCOPIC PROCEDURE TODAY AT [facility]:   Refer to the procedure report that was given to you for any specific questions about what was found during the examination.  If the procedure report does not answer your questions, please call your gastroenterologist to clarify.  If you requested that your care partner not be given the details of your procedure findings, then the procedure report has been included in a sealed envelope for you to review at your convenience later.  YOU SHOULD EXPECT: Some feelings of bloating in the abdomen. Passage of more gas than usual.  Walking can help get rid of the air that was put into your GI tract during the procedure and reduce the bloating. If you had a lower endoscopy (such as a colonoscopy or flexible sigmoidoscopy) you may notice spotting of blood in your stool or on the toilet paper. If you underwent a bowel prep for your procedure, you may not have a normal bowel movement for a few days.  Please Note:  You might notice some irritation and congestion in your nose or some drainage.  This is from the oxygen used during your procedure.  There is no need for concern and it should clear up in a day or so.  SYMPTOMS TO REPORT IMMEDIATELY:   Following lower endoscopy (colonoscopy or flexible sigmoidoscopy):  Excessive amounts of blood in the stool  Significant tenderness or worsening  of abdominal pains  Swelling of the abdomen that is new, acute  Fever of 100F or higher   For urgent or emergent issues, a gastroenterologist can be reached at any hour by calling [phone].   DIET: Your first meal following the procedure should be a small meal and then it is ok to progress to your normal diet. Heavy or fried foods are harder to digest and may make you feel nauseous or bloated.  Likewise, meals heavy in dairy and vegetables can increase bloating.  Drink plenty of fluids but you should avoid alcoholic beverages for 24 hours.  ACTIVITY:  You should plan to take it easy for the rest of today and you should NOT DRIVE or use heavy machinery until tomorrow (because of the sedation medicines used during the test).    FOLLOW UP: Our staff will call the number listed on your records the next business day following your procedure to check on you and address any questions or concerns that you may have regarding the information given to you following your procedure. If we do not reach you, we will leave a message.  However, if you are feeling well and you are not experiencing any problems, there is no need to return our call.  We will assume that you have returned to your regular daily activities without incident.  If any biopsies were taken you will be contacted  by phone or by letter within the next 1-3 weeks.  Please call us at [phone] if you have not heard about the biopsies in 3 weeks.    SIGNATURES/CONFIDENTIALITY: You and/or your care partner have signed paperwork which will be entered into your electronic medical record.  These signatures attest to the fact that that the information above on your After Visit Summary has been reviewed and is understood.  Full responsibility of the confidentiality of this discharge information lies with you and/or your care-partner.    Handouts were given to your care partner on polyps, irritable bowel syndrome, and the FODMOP diet. You  may resume your current medications today. Await biopsy results. Please call if any questions or concerns.

## 2016-03-13 NOTE — Progress Notes (Signed)
A/ox3, pleased with MAC, report to RN 

## 2016-03-13 NOTE — Progress Notes (Signed)
No problems noted in the recovery room. maw 

## 2016-03-13 NOTE — Progress Notes (Signed)
Called to room to assist during endoscopic procedure.  Patient ID and intended procedure confirmed with present staff. Received instructions for my participation in the procedure from the performing physician.  

## 2016-03-13 NOTE — Op Note (Signed)
[place] Patient Name: Kathryn Roberts Procedure Date: 03/13/2016 9:44 AM MRN: [id number] Endoscopist: Gatha Mayer , MD Age: 30 Referring MD:  Date of Birth: 26-Feb-1986 Gender: Female Procedure:                Colonoscopy Indications:              Surveillance: Personal history of adenomatous                            polyps on last colonoscopy > 5 years ago Medicines:                Propofol per Anesthesia, Monitored Anesthesia Care Procedure:                Pre-Anesthesia Assessment:                           - Prior to the procedure, a History and Physical                            was performed, and patient medications and                            allergies were reviewed. The patient's tolerance of                            previous anesthesia was also reviewed. The risks                            and benefits of the procedure and the sedation                            options and risks were discussed with the patient.                            All questions were answered, and informed consent                            was obtained. Prior Anticoagulants: The patient has                            taken no previous anticoagulant or antiplatelet                            agents. ASA Grade Assessment: II - A patient with                            mild systemic disease. After reviewing the risks                            and benefits, the patient was deemed in                            satisfactory condition to undergo the procedure.  After obtaining informed consent, the colonoscope                            was passed under direct vision. Throughout the                            procedure, the patient's blood pressure, pulse, and                            oxygen saturations were monitored continuously. The                            Model CF-HQ190L [id number]) scope was introduced                            through the anus and  advanced to the the terminal                            ileum, with identification of the appendiceal                            orifice and IC valve. The colonoscopy was performed                            without difficulty. The patient tolerated the                            procedure well. The quality of the bowel                            preparation was excellent. The bowel preparation                            used was Miralax. The terminal ileum, ileocecal                            valve, appendiceal orifice, and rectum were                            photographed. Scope In: 9:53:47 AM Scope Out: 10:04:08 AM Scope Withdrawal Time: 0 hours 7 minutes 4 seconds  Total Procedure Duration: 0 hours 10 minutes 21 seconds  Findings:                 The perianal and digital rectal examinations were                            normal.                           A 2 mm polyp was found in the cecum. The polyp was                            sessile. The polyp was removed with a cold biopsy  forceps. Resection and retrieval were complete.                            Verification of patient identification for the                            specimen was done. Estimated blood loss was minimal.                           The terminal ileum appeared normal.                           The exam was otherwise without abnormality on                            direct and retroflexion views. Complications:            No immediate complications. Estimated Blood Loss:     Estimated blood loss was minimal. Impression:               - One 2 mm polyp in the cecum, removed with a cold                            biopsy forceps. Resected and retrieved.                           - The examined portion of the ileum was normal.                           - The examination was otherwise normal on direct                            and retroflexion views.                           - Personal  history of colonic polyps. rectal adenoma Recommendation:           - Patient has a contact number available for                            emergencies. The signs and symptoms of potential                            delayed complications were discussed with the                            patient. Return to normal activities tomorrow.                            Written discharge instructions were provided to the                            patient.                           - Resume previous diet.                           -  Continue present medications.                           - Repeat colonoscopy is recommended for                            surveillance. The colonoscopy date will be                            determined after pathology results from today's                            exam become available for review.                           - May use loperamide as needed, consider VSL # 3                            probiotic, FODMAPS diet see me prn IBS Gatha Mayer, MD 03/13/2016 10:15:52 AM This report has been signed electronically. CC Letter to:             Elsie Lincoln, MD

## 2016-03-14 ENCOUNTER — Telehealth: Payer: Self-pay

## 2016-03-14 NOTE — Telephone Encounter (Signed)
  Follow up Call-  Call back number 03/13/2016  Post procedure Call Back phone  # [phone]  Permission to leave phone message Yes     Patient questions:  Do you have a fever, pain , or abdominal swelling? No. Pain Score  0 *  Have you tolerated food without any problems? Yes.    Have you been able to return to your normal activities? Yes.    Do you have any questions about your discharge instructions: Diet   No. Medications  No. Follow up visit  No.  Do you have questions or concerns about your Care? No.  Actions: * If pain score is 4 or above: No action needed, pain <4.

## 2016-03-21 NOTE — Progress Notes (Signed)
Quick Note:  2 mm adenoma Recall 2022 My Chart note sent - no letter needed ______

## 2016-04-03 DIAGNOSIS — B029 Zoster without complications: Secondary | ICD-10-CM | POA: Diagnosis not present

## 2016-04-03 DIAGNOSIS — Z6826 Body mass index (BMI) 26.0-26.9, adult: Secondary | ICD-10-CM | POA: Diagnosis not present

## 2016-04-03 DIAGNOSIS — Z1389 Encounter for screening for other disorder: Secondary | ICD-10-CM | POA: Diagnosis not present

## 2016-04-10 DIAGNOSIS — Z0000 Encounter for general adult medical examination without abnormal findings: Secondary | ICD-10-CM | POA: Diagnosis not present

## 2016-04-10 DIAGNOSIS — Z6826 Body mass index (BMI) 26.0-26.9, adult: Secondary | ICD-10-CM | POA: Diagnosis not present

## 2016-04-10 DIAGNOSIS — E663 Overweight: Secondary | ICD-10-CM | POA: Diagnosis not present

## 2016-04-10 DIAGNOSIS — Z01419 Encounter for gynecological examination (general) (routine) without abnormal findings: Secondary | ICD-10-CM | POA: Diagnosis not present

## 2016-05-13 DIAGNOSIS — Z01419 Encounter for gynecological examination (general) (routine) without abnormal findings: Secondary | ICD-10-CM | POA: Diagnosis not present

## 2016-07-05 DIAGNOSIS — N911 Secondary amenorrhea: Secondary | ICD-10-CM | POA: Diagnosis not present

## 2016-07-12 DIAGNOSIS — Z36 Encounter for antenatal screening of mother: Secondary | ICD-10-CM | POA: Diagnosis not present

## 2016-07-12 LAB — OB RESULTS CONSOLE GC/CHLAMYDIA
CHLAMYDIA, DNA PROBE: NEGATIVE
GC PROBE AMP, GENITAL: NEGATIVE

## 2016-07-12 LAB — OB RESULTS CONSOLE HEPATITIS B SURFACE ANTIGEN: Hepatitis B Surface Ag: NEGATIVE

## 2016-07-12 LAB — OB RESULTS CONSOLE RPR: RPR: NONREACTIVE

## 2016-07-12 LAB — OB RESULTS CONSOLE HIV ANTIBODY (ROUTINE TESTING): HIV: NONREACTIVE

## 2016-07-12 LAB — OB RESULTS CONSOLE RUBELLA ANTIBODY, IGM: Rubella: IMMUNE

## 2016-07-25 DIAGNOSIS — Z3A09 9 weeks gestation of pregnancy: Secondary | ICD-10-CM | POA: Diagnosis not present

## 2016-07-25 DIAGNOSIS — Z3491 Encounter for supervision of normal pregnancy, unspecified, first trimester: Secondary | ICD-10-CM | POA: Diagnosis not present

## 2016-07-25 DIAGNOSIS — Z113 Encounter for screening for infections with a predominantly sexual mode of transmission: Secondary | ICD-10-CM | POA: Diagnosis not present

## 2016-08-12 DIAGNOSIS — Z3491 Encounter for supervision of normal pregnancy, unspecified, first trimester: Secondary | ICD-10-CM | POA: Diagnosis not present

## 2016-08-12 DIAGNOSIS — Z3A12 12 weeks gestation of pregnancy: Secondary | ICD-10-CM | POA: Diagnosis not present

## 2016-08-12 DIAGNOSIS — Z36 Encounter for antenatal screening for chromosomal anomalies: Secondary | ICD-10-CM | POA: Diagnosis not present

## 2016-08-12 DIAGNOSIS — Z3682 Encounter for antenatal screening for nuchal translucency: Secondary | ICD-10-CM | POA: Diagnosis not present

## 2016-08-15 DIAGNOSIS — B349 Viral infection, unspecified: Secondary | ICD-10-CM | POA: Diagnosis not present

## 2016-09-09 DIAGNOSIS — R8290 Unspecified abnormal findings in urine: Secondary | ICD-10-CM | POA: Diagnosis not present

## 2016-09-24 DIAGNOSIS — Z363 Encounter for antenatal screening for malformations: Secondary | ICD-10-CM | POA: Diagnosis not present

## 2016-09-24 DIAGNOSIS — Z3480 Encounter for supervision of other normal pregnancy, unspecified trimester: Secondary | ICD-10-CM | POA: Diagnosis not present

## 2016-09-24 DIAGNOSIS — Z3A18 18 weeks gestation of pregnancy: Secondary | ICD-10-CM | POA: Diagnosis not present

## 2016-10-30 DIAGNOSIS — D509 Iron deficiency anemia, unspecified: Secondary | ICD-10-CM | POA: Diagnosis not present

## 2016-11-01 DIAGNOSIS — M869 Osteomyelitis, unspecified: Secondary | ICD-10-CM | POA: Diagnosis not present

## 2016-11-12 ENCOUNTER — Inpatient Hospital Stay (HOSPITAL_COMMUNITY)
Admission: AD | Admit: 2016-11-12 | Discharge: 2016-11-12 | Disposition: A | Discharge status: Home / Self Care | Payer: BLUE CROSS/BLUE SHIELD | Source: Ambulatory Visit | Attending: Obstetrics and Gynecology | Admitting: Obstetrics and Gynecology

## 2016-11-12 ENCOUNTER — Encounter (HOSPITAL_COMMUNITY): Payer: Self-pay | Admitting: *Deleted

## 2016-11-12 DIAGNOSIS — R000 Tachycardia, unspecified: Secondary | ICD-10-CM

## 2016-11-12 DIAGNOSIS — O9989 Other specified diseases and conditions complicating pregnancy, childbirth and the puerperium: Secondary | ICD-10-CM | POA: Diagnosis not present

## 2016-11-12 DIAGNOSIS — Z3A25 25 weeks gestation of pregnancy: Secondary | ICD-10-CM | POA: Insufficient documentation

## 2016-11-12 DIAGNOSIS — O99512 Diseases of the respiratory system complicating pregnancy, second trimester: Secondary | ICD-10-CM | POA: Insufficient documentation

## 2016-11-12 DIAGNOSIS — Z7982 Long term (current) use of aspirin: Secondary | ICD-10-CM | POA: Diagnosis not present

## 2016-11-12 DIAGNOSIS — J111 Influenza due to unidentified influenza virus with other respiratory manifestations: Secondary | ICD-10-CM | POA: Diagnosis not present

## 2016-11-12 DIAGNOSIS — J101 Influenza due to other identified influenza virus with other respiratory manifestations: Secondary | ICD-10-CM | POA: Diagnosis not present

## 2016-11-12 DIAGNOSIS — R140 Abdominal distension (gaseous): Secondary | ICD-10-CM

## 2016-11-12 LAB — URINALYSIS, ROUTINE W REFLEX MICROSCOPIC
Bilirubin Urine: NEGATIVE
GLUCOSE, UA: 150 mg/dL — AB
HGB URINE DIPSTICK: NEGATIVE
KETONES UR: NEGATIVE mg/dL
Leukocytes, UA: NEGATIVE
Nitrite: NEGATIVE
PH: 7 (ref 5.0–8.0)
PROTEIN: NEGATIVE mg/dL
Specific Gravity, Urine: 1.008 (ref 1.005–1.030)

## 2016-11-12 LAB — INFLUENZA PANEL BY PCR (TYPE A & B)
Influenza A By PCR: POSITIVE — AB
Influenza B By PCR: NEGATIVE

## 2016-11-12 MED ORDER — GUAIFENESIN ER 600 MG PO TB12: 600.0000 mg | ORAL_TABLET | Freq: Two times a day (BID) | ORAL | 1 refills | Status: DC

## 2016-11-12 MED ORDER — SIMETHICONE 80 MG PO CHEW: 80.0000 mg | CHEWABLE_TABLET | Freq: Four times a day (QID) | ORAL | 0 refills | Status: DC | PRN

## 2016-11-12 MED ORDER — OSELTAMIVIR PHOSPHATE 75 MG PO CAPS: 75.0000 mg | ORAL_CAPSULE | Freq: Two times a day (BID) | ORAL | 0 refills | Status: DC

## 2016-11-12 MED ORDER — LACTATED RINGERS IV SOLN
INTRAVENOUS | Status: DC
  Administered 2016-11-12: 12:00:00 via INTRAVENOUS

## 2016-11-12 MED ORDER — SIMETHICONE 80 MG PO CHEW
80.0000 mg | CHEWABLE_TABLET | Freq: Once | ORAL | Status: AC
  Administered 2016-11-12: 80 mg via ORAL
  Filled 2016-11-12 (×2): qty 1

## 2016-11-12 MED ORDER — ACETAMINOPHEN 325 MG PO TABS
650.0000 mg | ORAL_TABLET | Freq: Four times a day (QID) | ORAL | Status: DC | PRN
Start: 2016-11-12 — End: 2016-11-12
  Administered 2016-11-12: 650 mg via ORAL
  Filled 2016-11-12: qty 2

## 2016-11-12 NOTE — Progress Notes (Signed)
OK to d/c efm per Carmelia Roller CNM

## 2016-11-12 NOTE — Progress Notes (Signed)
Written and verbal d/c instructions given and understanding voiced. 

## 2016-11-12 NOTE — MAU Note (Signed)
Called office because pulse rate was up to 120.  Was seen at the office.  Sent over for fluids to see if that helps. Denies cardiac hx.  ? Fever during the night.

## 2016-11-12 NOTE — MAU Provider Note (Signed)
Chief Complaint:  No chief complaint on file.   First Provider Initiated Contact with Patient 11/12/16 1152     HPI: Kathryn Roberts is a 31 y.o. G2P1001 at 98w4dwho presents to maternity admissions reporting Tachycardia today. Feels fast heartbeat. Denies shortness of breath or chest pain. No history of heart problems.  Does have some feverishness, aches and sore throat. Intermittent abdominal gas pains. . She reports good fetal movement, denies LOF, vaginal bleeding, vaginal itching/burning, urinary symptoms, h/a, dizziness, n/v, diarrhea, constipation.  URI   This is a new problem. The current episode started today. The problem has been unchanged. The maximum temperature recorded prior to her arrival was 100.4 - 100.9 F. The fever has been present for less than 1 day. Associated symptoms include abdominal pain, congestion, joint pain and rhinorrhea. Pertinent negatives include no chest pain, coughing, diarrhea, dysuria, headaches, nausea or vomiting. Associated symptoms comments: Presents with Tachycardia today . She has tried nothing for the symptoms.   RN Note: Called office because pulse rate was up to 120.  Was seen at the office.  Sent over for fluids to see if that helps. Denies cardiac hx.  ? Fever during the night.  Past Medical History: Past Medical History:  Diagnosis Date  . ADD (attention deficit disorder)   . Family history of colon cancer   . GERD (gastroesophageal reflux disease)   . H/O varicella   . History of adenomatous polyp of colon   . History of gastric ulcer   . History of shingles    2012--  residual intermittant rash  . IBS (irritable bowel syndrome)   . PONV (postoperative nausea and vomiting)   . SUI (stress urinary incontinence, female)     Past obstetric history: OB History  Gravida Para Term Preterm AB Living  2 1 1     1   SAB TAB Ectopic Multiple Live Births          1    # Outcome Date GA Lbr Len/2nd Weight Sex Delivery Anes PTL Lv  2 Current            1 Term 10/20/12 [redacted]w[redacted]d 05:30 / 01:58 7 lb 12.7 oz (3.535 kg) F Vag-Vacuum EPI  LIV      Past Surgical History: Past Surgical History:  Procedure Laterality Date  . COLONOSCOPY W/ POLYPECTOMY  2007, 2011, 2017   rectal adenoma - 07  . LAPAROSCOPIC CHOLECYSTECTOMY  10-18-2005  . PUBOVAGINAL SLING N/A 10/10/2014   Procedure: Gaynelle Arabian;  Surgeon: Ailene Rud, MD;  Location: [facility];  Service: Urology;  Laterality: N/A;  . REPAIR LACERATION OF FOREHEAD  age 1    Family History: Family History  Problem Relation Age of Onset  . Hypertension Mother   . Alcohol abuse Brother   . Diabetes Maternal Uncle   . Diabetes Paternal Aunt   . Seizures Paternal Uncle   . Diabetes Paternal Uncle   . Hypertension Maternal Grandmother   . Diabetes Maternal Grandmother   . Hypertension Maternal Grandfather   . Cancer Maternal Grandfather     prostate  . Heart disease Paternal Grandmother   . Diabetes Paternal Grandmother   . Cancer Cousin     pancreatic  . Diabetes Paternal Uncle     Social History: Social History  Substance Use Topics  . Smoking status: Never Smoker  . Smokeless tobacco: Never Used  . Alcohol use 4.2 oz/week    7 Glasses of wine per week  Comment: one wine daily    Allergies:  Allergies  Allergen Reactions  . Codeine Nausea Only  . Hydrocodone-Acetaminophen Nausea Only    Meds:  Prescriptions Prior to Admission  Medication Sig Dispense Refill Last Dose  . aspirin-acetaminophen-caffeine (EXCEDRIN MIGRAINE) 250-250-65 MG tablet Take 1 tablet by mouth every 6 (six) hours as needed for headache.   Unknown  . calcium carbonate (TUMS - DOSED IN MG ELEMENTAL CALCIUM) 500 MG chewable tablet Chew 1 tablet by mouth as needed for indigestion or heartburn.   Unknown  . ibuprofen (ADVIL,MOTRIN) 800 MG tablet Take 800 mg by mouth every 8 (eight) hours as needed.   Unknown    I have reviewed patient's Past Medical Hx, Surgical Hx,  Family Hx, Social Hx, medications and allergies.   ROS:  Review of Systems  Constitutional: Positive for fatigue and fever. Negative for chills.  HENT: Positive for congestion and rhinorrhea.   Respiratory: Negative for cough.   Cardiovascular: Negative for chest pain.  Gastrointestinal: Positive for abdominal pain. Negative for diarrhea, nausea and vomiting.  Genitourinary: Negative for dysuria, pelvic pain, vaginal bleeding and vaginal discharge.  Musculoskeletal: Positive for back pain and joint pain.  Neurological: Negative for headaches.   Other systems negative  but ill-appearing Physical Exam  Patient Vitals for the past 24 hrs:  BP Temp Temp src Pulse Resp SpO2 Weight  11/12/16 1131 114/60 100.1 F (37.8 C) Oral 109 20 100 % 175 lb 8 oz (79.6 kg)   Constitutional: Well-developed, well-nourished female in no acute distress but somewhat ill-appearing  Cardiovascular: tachycardic, low 100s, normal rhythm with no ectopy HEENT:  Bilateral TMs clear, some erethema to throat. Respiratory: normal effort, clear to auscultation bilaterally GI: Abd soft, non-tender, gravid appropriate for gestational age.   No rebound or guarding. MS: Extremities nontender, no edema, normal ROM Neurologic: Alert and oriented x 4.  GU: Neg CVAT.  PELVIC EXAM:  deferred  FHT:  Baseline 140 , moderate variability, accelerations present, no decelerations Contractions:  Rare   Labs: Results for orders placed or performed during the hospital encounter of 11/12/16 (from the past 24 hour(s))  Urinalysis, Routine w reflex microscopic     Status: Abnormal   Collection Time: 11/12/16 11:35 AM  Result Value Ref Range   Color, Urine YELLOW YELLOW   APPearance CLEAR CLEAR   Specific Gravity, Urine 1.008 1.005 - 1.030   pH 7.0 5.0 - 8.0   Glucose, UA 150 (A) NEGATIVE mg/dL   Hgb urine dipstick NEGATIVE NEGATIVE   Bilirubin Urine NEGATIVE NEGATIVE   Ketones, ur NEGATIVE NEGATIVE mg/dL   Protein, ur  NEGATIVE NEGATIVE mg/dL   Nitrite NEGATIVE NEGATIVE   Leukocytes, UA NEGATIVE NEGATIVE  Influenza panel by PCR (type A & B)     Status: Abnormal   Collection Time: 11/12/16 12:00 PM  Result Value Ref Range   Influenza A By PCR POSITIVE (A) NEGATIVE   Influenza B By PCR NEGATIVE NEGATIVE    Imaging:  No results found.  MAU Course/MDM: I have ordered labs and reviewed results. Positive for Flu A. NST reviewed Consult Dr Helane Rima, who did come by and see the patient,  with presentation, exam findings and test results.  Treatments in MAU included Simethicone, tylenol, and IV fluids.  The IV fluids brought her heart rate down to the 90s.  She felt some relief of bloating/gas pains with simethicone.  Discussed course of Flu and use of Tamiflu in pregnancy.    Assessment: SIUP at 100w4d  Influenza A - Plan: Discharge patient  Abdominal bloating - relieved by simethicone - Plan: Discharge patient  Tachycardia - likely due to flu, now improved - Plan: Discharge patient   Plan: Discharge home Supportive care Rx Tamiflu for 5 days for influenza Preterm Labor precautions and fetal kick counts Follow up in Office for prenatal visits and recheck of status when afebrile  Encouraged to return here or to other Urgent Care/ED if she develops worsening of symptoms, increase in pain, fever, or other concerning symptoms.   Pt stable at time of discharge.  Hansel Feinstein CNM, MSN Certified Nurse-Midwife 11/12/2016 11:56 AM

## 2016-11-12 NOTE — Discharge Instructions (Signed)

## 2016-11-12 NOTE — MAU Note (Signed)
Pt denies resp/flu symptoms

## 2016-11-25 DIAGNOSIS — Z3480 Encounter for supervision of other normal pregnancy, unspecified trimester: Secondary | ICD-10-CM | POA: Diagnosis not present

## 2016-11-28 DIAGNOSIS — Z3A27 27 weeks gestation of pregnancy: Secondary | ICD-10-CM | POA: Diagnosis not present

## 2016-11-28 DIAGNOSIS — O99810 Abnormal glucose complicating pregnancy: Secondary | ICD-10-CM | POA: Diagnosis not present

## 2016-12-13 DIAGNOSIS — Z3480 Encounter for supervision of other normal pregnancy, unspecified trimester: Secondary | ICD-10-CM | POA: Diagnosis not present

## 2016-12-25 DIAGNOSIS — N760 Acute vaginitis: Secondary | ICD-10-CM | POA: Diagnosis not present

## 2017-01-20 DIAGNOSIS — D509 Iron deficiency anemia, unspecified: Secondary | ICD-10-CM | POA: Diagnosis not present

## 2017-01-20 DIAGNOSIS — Z3685 Encounter for antenatal screening for Streptococcus B: Secondary | ICD-10-CM | POA: Diagnosis not present

## 2017-01-29 DIAGNOSIS — O26893 Other specified pregnancy related conditions, third trimester: Secondary | ICD-10-CM | POA: Diagnosis not present

## 2017-01-29 DIAGNOSIS — Z3A36 36 weeks gestation of pregnancy: Secondary | ICD-10-CM | POA: Diagnosis not present

## 2017-02-07 ENCOUNTER — Encounter (HOSPITAL_COMMUNITY): Payer: Self-pay

## 2017-02-07 LAB — OB RESULTS CONSOLE GBS: STREP GROUP B AG: NEGATIVE

## 2017-02-10 ENCOUNTER — Inpatient Hospital Stay (HOSPITAL_COMMUNITY): Payer: BLUE CROSS/BLUE SHIELD | Admitting: Anesthesiology

## 2017-02-10 ENCOUNTER — Encounter (HOSPITAL_COMMUNITY): Payer: Self-pay | Admitting: Anesthesiology

## 2017-02-10 ENCOUNTER — Encounter (HOSPITAL_COMMUNITY)
Admission: AD | Disposition: A | Discharge status: Home / Self Care | Payer: Self-pay | Source: Ambulatory Visit | Attending: Obstetrics and Gynecology

## 2017-02-10 ENCOUNTER — Inpatient Hospital Stay (HOSPITAL_COMMUNITY)
Admission: AD | Admit: 2017-02-10 | Discharge: 2017-02-12 | DRG: 766 | Disposition: A | Discharge status: Home / Self Care | Payer: BLUE CROSS/BLUE SHIELD | Source: Ambulatory Visit | Attending: Obstetrics and Gynecology | Admitting: Obstetrics and Gynecology

## 2017-02-10 DIAGNOSIS — K219 Gastro-esophageal reflux disease without esophagitis: Secondary | ICD-10-CM | POA: Diagnosis not present

## 2017-02-10 DIAGNOSIS — O4292 Full-term premature rupture of membranes, unspecified as to length of time between rupture and onset of labor: Secondary | ICD-10-CM | POA: Diagnosis not present

## 2017-02-10 DIAGNOSIS — O9962 Diseases of the digestive system complicating childbirth: Secondary | ICD-10-CM | POA: Diagnosis present

## 2017-02-10 DIAGNOSIS — J069 Acute upper respiratory infection, unspecified: Secondary | ICD-10-CM | POA: Diagnosis present

## 2017-02-10 DIAGNOSIS — Z3A38 38 weeks gestation of pregnancy: Secondary | ICD-10-CM

## 2017-02-10 DIAGNOSIS — O26893 Other specified pregnancy related conditions, third trimester: Principal | ICD-10-CM | POA: Diagnosis present

## 2017-02-10 DIAGNOSIS — O9952 Diseases of the respiratory system complicating childbirth: Secondary | ICD-10-CM | POA: Diagnosis present

## 2017-02-10 DIAGNOSIS — Z2882 Immunization not carried out because of caregiver refusal: Secondary | ICD-10-CM | POA: Diagnosis not present

## 2017-02-10 LAB — CBC
HCT: 33.9 % — ABNORMAL LOW (ref 36.0–46.0)
Hemoglobin: 11.4 g/dL — ABNORMAL LOW (ref 12.0–15.0)
MCH: 31.7 pg (ref 26.0–34.0)
MCHC: 33.6 g/dL (ref 30.0–36.0)
MCV: 94.2 fL (ref 78.0–100.0)
PLATELETS: 186 10*3/uL (ref 150–400)
RBC: 3.6 MIL/uL — ABNORMAL LOW (ref 3.87–5.11)
RDW: 14.8 % (ref 11.5–15.5)
WBC: 9.2 10*3/uL (ref 4.0–10.5)

## 2017-02-10 LAB — TYPE AND SCREEN
ABO/RH(D): A NEG
ANTIBODY SCREEN: NEGATIVE

## 2017-02-10 LAB — POCT FERN TEST: POCT Fern Test: POSITIVE

## 2017-02-10 SURGERY — Surgical Case
Anesthesia: Spinal | Wound class: Clean Contaminated

## 2017-02-10 MED ORDER — SODIUM CHLORIDE 0.9 % IR SOLN
Status: DC | PRN
  Administered 2017-02-10: 1000 mL

## 2017-02-10 MED ORDER — COCONUT OIL OIL
1.0000 "application " | TOPICAL_OIL | Status: DC | PRN
  Administered 2017-02-11: 1 via TOPICAL
  Filled 2017-02-10: qty 120

## 2017-02-10 MED ORDER — KETOROLAC TROMETHAMINE 30 MG/ML IJ SOLN
30.0000 mg | Freq: Four times a day (QID) | INTRAMUSCULAR | Status: AC | PRN
  Administered 2017-02-10: 30 mg via INTRAVENOUS
  Filled 2017-02-10: qty 1

## 2017-02-10 MED ORDER — NALBUPHINE HCL 10 MG/ML IJ SOLN: 5.0000 mg | INTRAMUSCULAR | Status: DC | PRN

## 2017-02-10 MED ORDER — OXYCODONE HCL 5 MG PO TABS: 10.0000 mg | ORAL_TABLET | ORAL | Status: DC | PRN

## 2017-02-10 MED ORDER — DIPHENHYDRAMINE HCL 50 MG/ML IJ SOLN: 12.5000 mg | INTRAMUSCULAR | Status: DC | PRN

## 2017-02-10 MED ORDER — TETANUS-DIPHTH-ACELL PERTUSSIS 5-2.5-18.5 LF-MCG/0.5 IM SUSP
0.5000 mL | Freq: Once | INTRAMUSCULAR | Status: AC
  Administered 2017-02-12: 0.5 mL via INTRAMUSCULAR
  Filled 2017-02-10: qty 0.5

## 2017-02-10 MED ORDER — ACETAMINOPHEN 325 MG PO TABS
650.0000 mg | ORAL_TABLET | ORAL | Status: DC | PRN
  Administered 2017-02-10 – 2017-02-11 (×5): 650 mg via ORAL
  Filled 2017-02-10 (×5): qty 2

## 2017-02-10 MED ORDER — OXYTOCIN 10 UNIT/ML IJ SOLN
INTRAMUSCULAR | Status: AC
  Filled 2017-02-10: qty 4

## 2017-02-10 MED ORDER — DEXAMETHASONE SODIUM PHOSPHATE 4 MG/ML IJ SOLN
INTRAMUSCULAR | Status: DC | PRN
  Administered 2017-02-10: 4 mg via INTRAVENOUS

## 2017-02-10 MED ORDER — OXYCODONE HCL 5 MG PO TABS: 5.0000 mg | ORAL_TABLET | ORAL | Status: DC | PRN

## 2017-02-10 MED ORDER — FENTANYL CITRATE (PF) 100 MCG/2ML IJ SOLN: 25.0000 ug | INTRAMUSCULAR | Status: DC | PRN

## 2017-02-10 MED ORDER — FENTANYL CITRATE (PF) 100 MCG/2ML IJ SOLN
INTRAMUSCULAR | Status: DC | PRN
  Administered 2017-02-10: 12.5 ug via INTRATHECAL

## 2017-02-10 MED ORDER — BUPIVACAINE IN DEXTROSE 0.75-8.25 % IT SOLN
INTRATHECAL | Status: DC | PRN
  Administered 2017-02-10: 1.4 mL via INTRATHECAL

## 2017-02-10 MED ORDER — DIPHENHYDRAMINE HCL 50 MG/ML IJ SOLN
INTRAMUSCULAR | Status: DC | PRN
  Administered 2017-02-10: 12.5 mg via INTRAVENOUS

## 2017-02-10 MED ORDER — SODIUM CHLORIDE 0.9% FLUSH: 3.0000 mL | INTRAVENOUS | Status: DC | PRN

## 2017-02-10 MED ORDER — ONDANSETRON HCL 4 MG/2ML IJ SOLN: 4.0000 mg | Freq: Three times a day (TID) | INTRAMUSCULAR | Status: DC | PRN

## 2017-02-10 MED ORDER — ONDANSETRON HCL 4 MG/2ML IJ SOLN
INTRAMUSCULAR | Status: AC
  Filled 2017-02-10: qty 2

## 2017-02-10 MED ORDER — SIMETHICONE 80 MG PO CHEW
80.0000 mg | CHEWABLE_TABLET | Freq: Three times a day (TID) | ORAL | Status: DC
Start: 2017-02-10 — End: 2017-02-12
  Administered 2017-02-10 – 2017-02-12 (×5): 80 mg via ORAL
  Filled 2017-02-10 (×6): qty 1

## 2017-02-10 MED ORDER — ONDANSETRON HCL 4 MG/2ML IJ SOLN
INTRAMUSCULAR | Status: DC | PRN
  Administered 2017-02-10: 4 mg via INTRAVENOUS

## 2017-02-10 MED ORDER — IBUPROFEN 600 MG PO TABS
600.0000 mg | ORAL_TABLET | Freq: Four times a day (QID) | ORAL | Status: DC
  Administered 2017-02-10 – 2017-02-12 (×7): 600 mg via ORAL
  Filled 2017-02-10 (×8): qty 1

## 2017-02-10 MED ORDER — SIMETHICONE 80 MG PO CHEW
80.0000 mg | CHEWABLE_TABLET | ORAL | Status: DC | PRN
  Administered 2017-02-10: 80 mg via ORAL

## 2017-02-10 MED ORDER — MEPERIDINE HCL 25 MG/ML IJ SOLN: 6.2500 mg | INTRAMUSCULAR | Status: DC | PRN

## 2017-02-10 MED ORDER — LACTATED RINGERS IV SOLN: INTRAVENOUS | Status: DC

## 2017-02-10 MED ORDER — SOD CITRATE-CITRIC ACID 500-334 MG/5ML PO SOLN
ORAL | Status: AC
  Administered 2017-02-10: 30 mL
  Filled 2017-02-10: qty 15

## 2017-02-10 MED ORDER — SCOPOLAMINE 1 MG/3DAYS TD PT72
MEDICATED_PATCH | TRANSDERMAL | Status: DC | PRN
  Administered 2017-02-10: 1 via TRANSDERMAL

## 2017-02-10 MED ORDER — NALBUPHINE HCL 10 MG/ML IJ SOLN: 5.0000 mg | Freq: Once | INTRAMUSCULAR | Status: DC | PRN

## 2017-02-10 MED ORDER — METOCLOPRAMIDE HCL 5 MG/ML IJ SOLN: 10.0000 mg | Freq: Once | INTRAMUSCULAR | Status: DC | PRN

## 2017-02-10 MED ORDER — DEXTROSE 5 % IV SOLN
2.0000 g | INTRAVENOUS | Status: DC
  Filled 2017-02-10: qty 2

## 2017-02-10 MED ORDER — SENNOSIDES-DOCUSATE SODIUM 8.6-50 MG PO TABS
2.0000 | ORAL_TABLET | ORAL | Status: DC
  Administered 2017-02-10 – 2017-02-11 (×2): 2 via ORAL
  Filled 2017-02-10 (×2): qty 2

## 2017-02-10 MED ORDER — MORPHINE SULFATE (PF) 0.5 MG/ML IJ SOLN
INTRAMUSCULAR | Status: AC
  Filled 2017-02-10: qty 10

## 2017-02-10 MED ORDER — LACTATED RINGERS IV SOLN
INTRAVENOUS | Status: DC | PRN
  Administered 2017-02-10: 06:00:00 via INTRAVENOUS

## 2017-02-10 MED ORDER — KETOROLAC TROMETHAMINE 30 MG/ML IJ SOLN: 30.0000 mg | Freq: Four times a day (QID) | INTRAMUSCULAR | Status: AC | PRN

## 2017-02-10 MED ORDER — PHENYLEPHRINE 8 MG IN D5W 100 ML (0.08MG/ML) PREMIX OPTIME
INJECTION | INTRAVENOUS | Status: DC | PRN
  Administered 2017-02-10: 60 ug/min via INTRAVENOUS

## 2017-02-10 MED ORDER — NALOXONE HCL 2 MG/2ML IJ SOSY
1.0000 ug/kg/h | PREFILLED_SYRINGE | INTRAVENOUS | Status: DC | PRN
  Filled 2017-02-10: qty 2

## 2017-02-10 MED ORDER — SCOPOLAMINE 1 MG/3DAYS TD PT72
1.0000 | MEDICATED_PATCH | Freq: Once | TRANSDERMAL | Status: DC
  Filled 2017-02-10: qty 1

## 2017-02-10 MED ORDER — DIPHENHYDRAMINE HCL 25 MG PO CAPS: 25.0000 mg | ORAL_CAPSULE | ORAL | Status: DC | PRN

## 2017-02-10 MED ORDER — ZOLPIDEM TARTRATE 5 MG PO TABS: 5.0000 mg | ORAL_TABLET | Freq: Every evening | ORAL | Status: DC | PRN

## 2017-02-10 MED ORDER — OXYTOCIN 40 UNITS IN LACTATED RINGERS INFUSION - SIMPLE MED: 2.5000 [IU]/h | INTRAVENOUS | Status: AC

## 2017-02-10 MED ORDER — SCOPOLAMINE 1 MG/3DAYS TD PT72
MEDICATED_PATCH | TRANSDERMAL | Status: AC
  Filled 2017-02-10: qty 1

## 2017-02-10 MED ORDER — WITCH HAZEL-GLYCERIN EX PADS: 1.0000 "application " | MEDICATED_PAD | CUTANEOUS | Status: DC | PRN

## 2017-02-10 MED ORDER — DIBUCAINE 1 % RE OINT: 1.0000 "application " | TOPICAL_OINTMENT | RECTAL | Status: DC | PRN

## 2017-02-10 MED ORDER — MORPHINE SULFATE (PF) 0.5 MG/ML IJ SOLN
INTRAMUSCULAR | Status: DC | PRN
  Administered 2017-02-10: .1 mg via INTRATHECAL

## 2017-02-10 MED ORDER — SIMETHICONE 80 MG PO CHEW
80.0000 mg | CHEWABLE_TABLET | ORAL | Status: DC
  Administered 2017-02-11: 80 mg via ORAL
  Filled 2017-02-10 (×2): qty 1

## 2017-02-10 MED ORDER — PRENATAL MULTIVITAMIN CH
1.0000 | ORAL_TABLET | Freq: Every day | ORAL | Status: DC
  Administered 2017-02-11 – 2017-02-12 (×2): 1 via ORAL
  Filled 2017-02-10 (×2): qty 1

## 2017-02-10 MED ORDER — DIPHENHYDRAMINE HCL 50 MG/ML IJ SOLN
INTRAMUSCULAR | Status: AC
  Filled 2017-02-10: qty 1

## 2017-02-10 MED ORDER — LACTATED RINGERS IV SOLN
INTRAVENOUS | Status: DC
  Administered 2017-02-10: 12:00:00 via INTRAVENOUS

## 2017-02-10 MED ORDER — DEXAMETHASONE SODIUM PHOSPHATE 4 MG/ML IJ SOLN
INTRAMUSCULAR | Status: AC
  Filled 2017-02-10: qty 1

## 2017-02-10 MED ORDER — FENTANYL CITRATE (PF) 100 MCG/2ML IJ SOLN
INTRAMUSCULAR | Status: AC
  Filled 2017-02-10: qty 2

## 2017-02-10 MED ORDER — PHENYLEPHRINE 8 MG IN D5W 100 ML (0.08MG/ML) PREMIX OPTIME
INJECTION | INTRAVENOUS | Status: AC
  Filled 2017-02-10: qty 100

## 2017-02-10 MED ORDER — MENTHOL 3 MG MT LOZG: 1.0000 | LOZENGE | OROMUCOSAL | Status: DC | PRN

## 2017-02-10 MED ORDER — FAMOTIDINE IN NACL 20-0.9 MG/50ML-% IV SOLN
INTRAVENOUS | Status: AC
  Administered 2017-02-10: 20 mg
  Filled 2017-02-10: qty 50

## 2017-02-10 MED ORDER — DIPHENHYDRAMINE HCL 25 MG PO CAPS: 25.0000 mg | ORAL_CAPSULE | Freq: Four times a day (QID) | ORAL | Status: DC | PRN

## 2017-02-10 MED ORDER — BUPIVACAINE IN DEXTROSE 0.75-8.25 % IT SOLN
INTRATHECAL | Status: AC
  Filled 2017-02-10: qty 2

## 2017-02-10 MED ORDER — LACTATED RINGERS IV SOLN
INTRAVENOUS | Status: DC
  Administered 2017-02-10 (×2): via INTRAVENOUS

## 2017-02-10 MED ORDER — LACTATED RINGERS IV SOLN
INTRAVENOUS | Status: DC | PRN
  Administered 2017-02-10: 40 [IU] via INTRAVENOUS

## 2017-02-10 MED ORDER — CEFAZOLIN SODIUM-DEXTROSE 2-3 GM-% IV SOLR
INTRAVENOUS | Status: DC | PRN
  Administered 2017-02-10: 2 g via INTRAVENOUS

## 2017-02-10 MED ORDER — NALOXONE HCL 0.4 MG/ML IJ SOLN: 0.4000 mg | INTRAMUSCULAR | Status: DC | PRN

## 2017-02-10 SURGICAL SUPPLY — 38 items
ADH SKN CLS APL DERMABOND .7 (GAUZE/BANDAGES/DRESSINGS) ×1
APL SKNCLS STERI-STRIP NONHPOA (GAUZE/BANDAGES/DRESSINGS) ×1
BENZOIN TINCTURE PRP APPL 2/3 (GAUZE/BANDAGES/DRESSINGS) ×1 IMPLANT
CHLORAPREP W/TINT 26ML (MISCELLANEOUS) ×2 IMPLANT
CLAMP CORD UMBIL (MISCELLANEOUS) IMPLANT
CLOTH BEACON ORANGE TIMEOUT ST (SAFETY) ×2 IMPLANT
DERMABOND ADVANCED (GAUZE/BANDAGES/DRESSINGS) ×1
DERMABOND ADVANCED .7 DNX12 (GAUZE/BANDAGES/DRESSINGS) ×1 IMPLANT
DRSG OPSITE POSTOP 4X10 (GAUZE/BANDAGES/DRESSINGS) ×2 IMPLANT
ELECT REM PT RETURN 9FT ADLT (ELECTROSURGICAL) ×2
ELECTRODE REM PT RTRN 9FT ADLT (ELECTROSURGICAL) ×1 IMPLANT
EXTRACTOR VACUUM KIWI (MISCELLANEOUS) IMPLANT
GLOVE BIO SURGEON STRL SZ 6.5 (GLOVE) ×2 IMPLANT
GLOVE BIOGEL PI IND STRL 6.5 (GLOVE) ×1 IMPLANT
GLOVE BIOGEL PI IND STRL 7.0 (GLOVE) ×2 IMPLANT
GLOVE BIOGEL PI INDICATOR 6.5 (GLOVE) ×1
GLOVE BIOGEL PI INDICATOR 7.0 (GLOVE) ×2
GOWN STRL REUS W/TWL LRG LVL3 (GOWN DISPOSABLE) ×4 IMPLANT
KIT ABG SYR 3ML LUER SLIP (SYRINGE) ×2 IMPLANT
NDL HYPO 25X5/8 SAFETYGLIDE (NEEDLE) ×1 IMPLANT
NEEDLE HYPO 25X5/8 SAFETYGLIDE (NEEDLE) ×2 IMPLANT
NS IRRIG 1000ML POUR BTL (IV SOLUTION) ×2 IMPLANT
PACK C SECTION WH (CUSTOM PROCEDURE TRAY) ×2 IMPLANT
PAD OB MATERNITY 4.3X12.25 (PERSONAL CARE ITEMS) ×2 IMPLANT
PENCIL SMOKE EVAC W/HOLSTER (ELECTROSURGICAL) ×2 IMPLANT
RETRACTOR WND ALEXIS 25 LRG (MISCELLANEOUS) IMPLANT
RTRCTR WOUND ALEXIS 25CM LRG (MISCELLANEOUS)
STRIP CLOSURE SKIN 1/2X4 (GAUZE/BANDAGES/DRESSINGS) ×1 IMPLANT
SUT PLAIN 0 NONE (SUTURE) IMPLANT
SUT PLAIN 2 0 (SUTURE) ×2
SUT PLAIN ABS 2-0 CT1 27XMFL (SUTURE) ×1 IMPLANT
SUT VIC AB 0 CT1 36 (SUTURE) ×3 IMPLANT
SUT VIC AB 0 CTX 36 (SUTURE) ×6
SUT VIC AB 0 CTX36XBRD ANBCTRL (SUTURE) ×2 IMPLANT
SUT VIC AB 4-0 KS 27 (SUTURE) IMPLANT
SUT VICRYL 4-0 PS2 18IN ABS (SUTURE) ×1 IMPLANT
TOWEL OR 17X24 6PK STRL BLUE (TOWEL DISPOSABLE) ×2 IMPLANT
TRAY FOLEY BAG SILVER LF 14FR (SET/KITS/TRAYS/PACK) IMPLANT

## 2017-02-10 NOTE — Lactation Note (Signed)
This note was copied from a baby's chart. Lactation Consultation Note: Mother is experienced breastfeeding mother. She reports breastfeeding her 31 yr old for 10 months. Infant is 7 hours old and has breastfed 3 times. Mother reports that infant is latching well and she denies having any discomfort with breastfeeding. Discussed cue base feeding , cluster feeding and frequent skin to skin. Mother advised to breastfed infant at least 8-12 times daily. Mother was given Lactation Brochure and a review of all LC services, [facility] 'S, Out Patient Dept,.  Mother to follow up as needed.   Patient Name: Kathryn Roberts [id number] Date: 02/10/2017 Reason for consult: Initial assessment   Maternal Data    Feeding Feeding Type: Breast Fed Length of feed: 35 min (per mom)  LATCH Score/Interventions                      Lactation Tools Discussed/Used     Consult Status Consult Status: Follow-up Date: 02/10/17 Follow-up type: In-patient    Jess Barters [facility] 02/10/2017, 1:54 PM

## 2017-02-10 NOTE — Op Note (Signed)
PROCEDURE DATE: 02/10/2017  PREOPERATIVE DIAGNOSIS: Intrauterine pregnancy at [redacted]w[redacted]d wga, Indication: elective primary (h/o vaginal sling and 4th degree laceration)  POSTOPERATIVE DIAGNOSIS:The same  PROCEDURE: primary Low TransverseCesarean Section  SURGEON: Dr. Lucillie Garfinkel  INDICATIONS:This is a 31yo G2P1001 at 38.3 wga who presented s/p SROM. She was 1cm. She elected to have a primary cesarean section s/s h/o vaginal sling and 4th degree vaginal laceration. The risks of cesarean section discussed with the patient included but were not limited to: bleeding which may require transfusion or reoperation; infection which may require antibiotics; injury to bowel, bladder, ureters or other surrounding organs; injury to the fetus; need for additional procedures including hysterectomy in the event of a life-threatening hemorrhage; placental abnormalities wth subsequent pregnancies, incisional problems, thromboembolic phenomenon and other postoperative/anesthesia complications. The patient agreed with the proposed plan, giving informed consent for the procedure.   FINDINGS: Viable femaleinfant in vertex presentation,APGARs pending, Weight pending, Amniotic fluid clear, Intact placenta, three vessel cord. Grossly normal uterus, ovaries and fallopian tubes.  .  ANESTHESIA: Epidural ESTIMATED BLOOD LOSS: 600cc SPECIMENS: Placenta for L&D COMPLICATIONS: None immediate   PROCEDURE IN DETAIL: The patient received intravenous antibiotics (2g Ancef) and had sequential compression devices applied to her lower extremities while in the preoperative area. Shewasthen taken to the operating roomwhere epidural anesthesiawas dosed up to surgical level andwas found to be adequate. She was then placed in a dorsal supine position with a leftward tilt,and prepped and draped in a sterile manner.A foley catheter was placed into her bladder and attached to constant gravity. After an adequate  timeout was performed, aPfannenstiel skin incision was made with scalpel and carried through to the underlying layer of fascia. The fascia was incised in the midline and this incision was extended bilaterally using the Mayo scissors. Kocher clamps were applied to the superior aspect of the fascial incision and the underlying rectus muscles were dissected off bluntly. A similar process was carried out on the inferior aspect of the facial incision. The rectus muscles were separated in the midline bluntly and the peritoneum was entered bluntly. A bladder flap was created sharply and developed bluntly.Atransverse hysterotomy was made with a scalpel and extended bilaterally bluntly. The bladder blade was then removed. The infant was successfully delivered, and cord was clamped and cut and infant was handed over to awaiting neonatology team. Uterine massage was then administered and the placenta delivered intact with three-vessel cord. Cord gases were taken. The uterus was cleared of clot and debris. The hysterotomy was closed with 0 vicryl.A second imbricating suture of 0-vicryl was used to reinforce the incision and aid in hemostasis.The rectus muscles were reapproximated with two interrupted stitches.The fascia was closed with 0-Vicryl in a running fashion with good restoration of anatomy. The subcutaneus tissue was irrigated and noted to be hemostatic.The skin was closed with 4-0 Vicryl in a subcuticular fashion.  Final EBL was 600cc (all surgical site and was hemostatic at end of procedure) without any further bleeding on exam.   Pt tolerated the procedure well. All sponge/lap/needle counts were correct X 2. Pt taken to recovery room in stable condition.   It's a girl - "Willow", to join her sister Ada!!  Lucillie Garfinkel MD

## 2017-02-10 NOTE — Anesthesia Postprocedure Evaluation (Signed)
Anesthesia Post Note  Patient: Kathryn Roberts  Procedure(s) Performed: Procedure(s) (LRB): CESAREAN SECTION (N/A)  Patient location during evaluation: Mother Baby Anesthesia Type: Spinal Level of consciousness: awake Pain management: satisfactory to patient Vital Signs Assessment: post-procedure vital signs reviewed and stable Respiratory status: spontaneous breathing Cardiovascular status: stable Anesthetic complications: no        Last Vitals:  Vitals:   02/10/17 1030 02/10/17 1130  BP: (!) 107/59 (!) 98/51  Pulse: (!) 56 68  Resp: 16 18  Temp: 36.7 C 36.8 C    Last Pain:  Vitals:   02/10/17 1217  TempSrc:   PainSc: 4    Pain Goal: Patients Stated Pain Goal: 3 (02/10/17 1217)               [name]

## 2017-02-10 NOTE — H&P (Signed)
Kathryn Roberts is a 31 y.o. female presenting for SROM. Desires pCS for h/o vaginal sling and 3rd (per patient it was a 4th, but our PNR say 3rd degree) degree vaginal laceration per pt. OB History    Gravida Para Term Preterm AB Living   2 1 1     1    SAB TAB Ectopic Multiple Live Births           1     Past Medical History:  Diagnosis Date  . ADD (attention deficit disorder)   . Family history of colon cancer   . GERD (gastroesophageal reflux disease)   . H/O varicella   . History of adenomatous polyp of colon   . History of gastric ulcer   . History of shingles    2012--  residual intermittant rash  . IBS (irritable bowel syndrome)   . PONV (postoperative nausea and vomiting)   . SUI (stress urinary incontinence, female)    Past Surgical History:  Procedure Laterality Date  . COLONOSCOPY W/ POLYPECTOMY  2007, 2011, 2017   rectal adenoma - 07  . LAPAROSCOPIC CHOLECYSTECTOMY  10-18-2005  . PUBOVAGINAL SLING N/A 10/10/2014   Procedure: Kathryn Roberts;  Surgeon: Kathryn Rud, MD;  Location: [facility];  Service: Urology;  Laterality: N/A;  . REPAIR LACERATION OF FOREHEAD  age 9   Family History: family history includes Alcohol abuse in her brother; Cancer in her cousin and maternal grandfather; Diabetes in her maternal grandmother, maternal uncle, paternal aunt, paternal grandmother, paternal uncle, and paternal uncle; Heart disease in her paternal grandmother; Hypertension in her maternal grandfather, maternal grandmother, and mother; Seizures in her paternal uncle. Social History:  reports that she has never smoked. She has never used smokeless tobacco. She reports that she drinks about 4.2 oz of alcohol per week . She reports that she does not use drugs.     Maternal Diabetes: No Genetic Screening: Normal Maternal Ultrasounds/Referrals: Normal Fetal Ultrasounds or other Referrals:  None Maternal Substance Abuse:  No Significant Maternal  Medications:  None Significant Maternal Lab Results:  None Other Comments:  None  ROS History Dilation: 1 Effacement (%): Thick Station: -3 Exam by:: V Rogers RN  Blood pressure 120/76, pulse 70, temperature 98 F (36.7 C), temperature source Oral, resp. rate 17, last menstrual period 02/19/2016, SpO2 98 %. Exam Physical Exam  Prenatal labs: ABO, Rh:   Antibody:   Rubella: Immune (09/15 0000) RPR: Nonreactive (09/15 0000)  HBsAg: Negative (09/15 0000)  HIV: Non-reactive (09/15 0000)  GBS: Negative (04/13 0000)   Assessment/Plan: 31yo G2P1001 @ 38.3wga presenting for SROM, desires pCS for h/o vaginal sling and 4th degree vaginal laceration. Pt is aware she has the option for VTOL and patient strongly desires C section. The risks were discussed including infection, bleeding, damage to the surrounding structures, the  Need for additional procedures, and hysterectomy. Patient signed consent and agreed to proceed. 2g Ancef on call to the OR.   Kathryn Roberts Kathryn Roberts 02/10/2017, 5:27 AM

## 2017-02-10 NOTE — Anesthesia Preprocedure Evaluation (Signed)
Anesthesia Evaluation  Patient identified by MRN, date of birth, ID band Patient awake    Reviewed: Allergy & Precautions, NPO status , Patient's Chart, lab work & pertinent test results  History of Anesthesia Complications (+) PONV  Airway Mallampati: II  TM Distance: >3 FB Neck ROM: Full    Dental no notable dental hx.    Pulmonary neg pulmonary ROS,    Pulmonary exam normal breath sounds clear to auscultation       Cardiovascular negative cardio ROS Normal cardiovascular exam Rhythm:Regular Rate:Normal     Neuro/Psych negative neurological ROS  negative psych ROS   GI/Hepatic Neg liver ROS, PUD, GERD  ,  Endo/Other  negative endocrine ROS  Renal/GU negative Renal ROS  negative genitourinary   Musculoskeletal negative musculoskeletal ROS (+)   Abdominal   Peds negative pediatric ROS (+)  Hematology negative hematology ROS (+)   Anesthesia Other Findings   Reproductive/Obstetrics (+) Pregnancy                             Anesthesia Physical Anesthesia Plan  ASA: II  Anesthesia Plan: Spinal   Post-op Pain Management:    Induction:   Airway Management Planned: Natural Airway  Additional Equipment:   Intra-op Plan:   Post-operative Plan:   Informed Consent: I have reviewed the patients History and Physical, chart, labs and discussed the procedure including the risks, benefits and alternatives for the proposed anesthesia with the patient or authorized representative who has indicated his/her understanding and acceptance.   Dental advisory given  Plan Discussed with: CRNA  Anesthesia Plan Comments:         Anesthesia Quick Evaluation

## 2017-02-10 NOTE — Transfer of Care (Signed)
Immediate Anesthesia Transfer of Care Note  Patient: Kathryn Roberts  Procedure(s) Performed: Procedure(s): CESAREAN SECTION (N/A)  Patient Location: PACU  Anesthesia Type:Spinal  Level of Consciousness: awake, alert  and oriented  Airway & Oxygen Therapy: Patient Spontanous Breathing  Post-op Assessment: Report given to RN and Post -op Vital signs reviewed and stable  Post vital signs: Reviewed and stable  Last Vitals:  Vitals:   02/10/17 0437  BP: 120/76  Pulse: 70  Resp: 17  Temp: 36.7 C    Last Pain:  Vitals:   02/10/17 0438  TempSrc:   PainSc: 4          Complications: No apparent anesthesia complications

## 2017-02-10 NOTE — Anesthesia Procedure Notes (Signed)
Spinal  Patient location during procedure: OR Staffing Anesthesiologist: Cesario Weidinger Performed: anesthesiologist  Preanesthetic Checklist Completed: patient identified, site marked, surgical consent, pre-op evaluation, timeout performed, IV checked, risks and benefits discussed and monitors and equipment checked Spinal Block Patient position: sitting Prep: DuraPrep Patient monitoring: heart rate, continuous pulse ox and blood pressure Approach: midline Location: L3-4 Injection technique: single-shot Needle Needle type: Sprotte  Needle gauge: 24 G Needle length: 9 cm Additional Notes Expiration date of kit checked and confirmed. Patient tolerated procedure well, without complications.       

## 2017-02-10 NOTE — Progress Notes (Signed)
Subjective: Postpartum Day 0: Cesarean Delivery Patient reports tolerating PO.    Objective: Vital signs in last 24 hours: Temp:  [97.6 F (36.4 C)-98 F (36.7 C)] 97.6 F (36.4 C) (04/16 0720) Pulse Rate:  [53-129] 53 (04/16 0802) Resp:  [14-17] 14 (04/16 0802) BP: (98-120)/(54-76) 98/60 (04/16 0802) SpO2:  [97 %-100 %] 100 % (04/16 0802)  Physical Exam:  General: alert and cooperative Lochia: appropriate Uterine Fundus: firm Incision: small drainage noted on left margin of incision DVT Evaluation: No evidence of DVT seen on physical exam. Negative Homan's sign. No cords or calf tenderness. No significant calf/ankle edema.   Recent Labs  02/10/17 0500  HGB 11.4*  HCT 33.9*    Assessment/Plan: Status post Cesarean section. Doing well postoperatively.  Continue current care.  Hadar Elgersma G 02/10/2017, 9:19 AM

## 2017-02-10 NOTE — Anesthesia Postprocedure Evaluation (Signed)
Anesthesia Post Note  Patient: Kathryn Roberts  Procedure(s) Performed: Procedure(s) (LRB): CESAREAN SECTION (N/A)  Patient location during evaluation: PACU Anesthesia Type: Spinal Level of consciousness: awake and alert and oriented Pain management: pain level controlled Vital Signs Assessment: post-procedure vital signs reviewed and stable Respiratory status: spontaneous breathing, nonlabored ventilation and respiratory function stable Cardiovascular status: blood pressure returned to baseline and stable Postop Assessment: no headache, no backache, spinal receding, patient able to bend at knees and no signs of nausea or vomiting Anesthetic complications: no        Last Vitals:  Vitals:   02/10/17 0730 02/10/17 0802  BP: (!) 107/54 98/60  Pulse: 68 (!) 53  Resp: 16 14  Temp:      Last Pain:  Vitals:   02/10/17 0720  TempSrc: Oral  PainSc: 0-No pain   Pain Goal:                 Cheskel Silverio A.

## 2017-02-10 NOTE — Addendum Note (Signed)
Addendum  created 02/10/17 1532 by Asher Muir, CRNA   Sign clinical note

## 2017-02-10 NOTE — Addendum Note (Signed)
Addendum  created 02/10/17 1534 by Asher Muir, CRNA   Charge Capture section accepted

## 2017-02-10 NOTE — Brief Op Note (Signed)
02/10/2017  7:04 AM  PATIENT:  Kathryn Roberts  31 y.o. female  PRE-OPERATIVE DIAGNOSIS:  SROM, desires primary cesarean, history of vaginal sling and fourth degree tear   POST-OPERATIVE DIAGNOSIS:  SROM, desires primary cesarean, history of vaginal sling and fourth degree tear   PROCEDURE:  Procedure(s): CESAREAN SECTION (N/A)  SURGEON:  Surgeon(s) and Role:    * Tyson Dense, MD - Primary  PHYSICIAN ASSISTANT:   ASSISTANTS: none   ANESTHESIA:   spinal  EBL:  No intake/output data recorded.  BLOOD ADMINISTERED:none  DRAINS: Urinary Catheter (Foley)   LOCAL MEDICATIONS USED:  NONE  SPECIMEN:  Source of Specimen:  placenta  DISPOSITION OF SPECIMEN:  L&D  COUNTS:  YES  TOURNIQUET:  * No tourniquets in log *  DICTATION: .Note written in EPIC  PLAN OF CARE: Admit to inpatient   PATIENT DISPOSITION:  PACU - hemodynamically stable.   Delay start of Pharmacological VTE agent (>24hrs) due to surgical blood loss or risk of bleeding: not applicable

## 2017-02-10 NOTE — Plan of Care (Signed)
Problem: Education: Goal: Knowledge of [facility] Education information/materials will improve Admission education reviewed with patient and significant other. Patient verbalizes understanding of information. Discussed pain medication and progression of care and diet.

## 2017-02-10 NOTE — MAU Note (Signed)
Pt states SROM at 0345-clear fluid. Irregular contractions. +FM. Cervix was closed on Monday.

## 2017-02-11 ENCOUNTER — Encounter (HOSPITAL_COMMUNITY): Payer: Self-pay | Admitting: Obstetrics and Gynecology

## 2017-02-11 LAB — RPR: RPR Ser Ql: NONREACTIVE

## 2017-02-11 LAB — CBC
HEMATOCRIT: 28.2 % — AB (ref 36.0–46.0)
HEMOGLOBIN: 9.4 g/dL — AB (ref 12.0–15.0)
MCH: 31.9 pg (ref 26.0–34.0)
MCHC: 33.3 g/dL (ref 30.0–36.0)
MCV: 95.6 fL (ref 78.0–100.0)
Platelets: 172 10*3/uL (ref 150–400)
RBC: 2.95 MIL/uL — AB (ref 3.87–5.11)
RDW: 14.8 % (ref 11.5–15.5)
WBC: 9.7 10*3/uL (ref 4.0–10.5)

## 2017-02-11 LAB — BIRTH TISSUE RECOVERY COLLECTION (PLACENTA DONATION)

## 2017-02-11 MED ORDER — BENZONATATE 100 MG PO CAPS
200.0000 mg | ORAL_CAPSULE | Freq: Three times a day (TID) | ORAL | Status: DC
  Administered 2017-02-11 – 2017-02-12 (×3): 200 mg via ORAL
  Filled 2017-02-11 (×7): qty 2

## 2017-02-11 MED ORDER — AMOXICILLIN-POT CLAVULANATE 875-125 MG PO TABS
1.0000 | ORAL_TABLET | Freq: Two times a day (BID) | ORAL | Status: DC
  Administered 2017-02-11 – 2017-02-12 (×3): 1 via ORAL
  Filled 2017-02-11 (×5): qty 1

## 2017-02-11 NOTE — Lactation Note (Signed)
This note was copied from a baby's chart. Lactation Consultation Note  Patient Name: Kathryn Roberts YBWLS'L Date: 02/11/2017 Reason for consult: Follow-up assessment Baby at 30 hr of life. Upon entry baby was cueing and mom was trying to offer a pacifier. She is reporting bilateral nipple pain and cluster feeding. Reviewed artificial nipples. Mom has bilateral dark pink nipples, no skin break down was noted. Mom requested comfort gels and they were given. She has coconut oil at the bedside that she states "does not work". Showed mom how to get a deep latch with the cross cradle position. Discussed baby behavior, feeding frequency, baby belly size, voids, wt loss, breast changes, and nipple care. Mom stated she can manually express and has a spoon in the room. Parents are aware of lactation services and support group.     Maternal Data    Feeding Feeding Type: Breast Fed  LATCH Score/Interventions Latch: Repeated attempts needed to sustain latch, nipple held in mouth throughout feeding, stimulation needed to elicit sucking reflex. Intervention(s): Adjust position;Assist with latch;Breast massage;Breast compression  Audible Swallowing: A few with stimulation Intervention(s): Alternate breast massage;Hand expression;Skin to skin  Type of Nipple: Everted at rest and after stimulation  Comfort (Breast/Nipple): Filling, red/small blisters or bruises, mild/mod discomfort  Problem noted: Mild/Moderate discomfort Interventions (Mild/moderate discomfort): Hand expression;Comfort gels  Hold (Positioning): Assistance needed to correctly position infant at breast and maintain latch. Intervention(s): Position options;Support Pillows  LATCH Score: 6  Lactation Tools Discussed/Used WIC Program: No   Consult Status Consult Status: Follow-up Date: 02/12/17 Follow-up type: In-patient    Denzil Hughes 02/11/2017, 12:56 PM

## 2017-02-11 NOTE — Progress Notes (Signed)
Subjective: Postpartum Day 1: Cesarean Delivery Patient reports tolerating PO, + flatus and no problems voiding.   Complains of upper respiratory congestion and productive cough Objective: Vital signs in last 24 hours: Temp:  [97 F (36.1 C)-98.6 F (37 C)] 97.9 F (36.6 C) (04/17 0451) Pulse Rate:  [56-70] 62 (04/17 0451) Resp:  [16-20] 18 (04/17 0451) BP: (97-108)/(51-61) 97/59 (04/17 0451) SpO2:  [96 %-100 %] 100 % (04/17 0451)  Physical Exam:  General: alert and cooperative Lochia: appropriate Uterine Fundus: firm Incision: healing well, old drainage noted on honeycomb dressing DVT Evaluation: No evidence of DVT seen on physical exam. Negative Homan's sign. No cords or calf tenderness. No significant calf/ankle edema.   Recent Labs  02/10/17 0500 02/11/17 0525  HGB 11.4* 9.4*  HCT 33.9* 28.2*    Assessment/Plan: Status post Cesarean section. Postoperative course complicated by URI  Augmentin and Tessalon .  Isidore Margraf G 02/11/2017, 8:04 AM

## 2017-02-12 MED ORDER — BENZONATATE 200 MG PO CAPS: 200.0000 mg | ORAL_CAPSULE | Freq: Three times a day (TID) | ORAL | 0 refills | Status: DC

## 2017-02-12 MED ORDER — AMOXICILLIN-POT CLAVULANATE 875-125 MG PO TABS: 1.0000 | ORAL_TABLET | Freq: Two times a day (BID) | ORAL | 0 refills | Status: DC

## 2017-02-12 MED ORDER — IBUPROFEN 600 MG PO TABS: 600.0000 mg | ORAL_TABLET | Freq: Four times a day (QID) | ORAL | 1 refills | Status: DC

## 2017-02-12 NOTE — Discharge Summary (Signed)
Obstetric Discharge Summary Reason for Admission: rupture of membranes Prenatal Procedures: ultrasound Intrapartum Procedures: cesarean: low cervical, transverse Postpartum Procedures: none Complications-Operative and Postpartum: none Hemoglobin  Date Value Ref Range Status  02/11/2017 9.4 (L) 12.0 - 15.0 g/dL Final   HCT  Date Value Ref Range Status  02/11/2017 28.2 (L) 36.0 - 46.0 % Final    Physical Exam:  General: alert and cooperative Lochia: appropriate Uterine Fundus: firm Incision: healing well DVT Evaluation: No evidence of DVT seen on physical exam. Negative Homan's sign. No cords or calf tenderness. No significant calf/ankle edema.  Discharge Diagnoses: Term Pregnancy-delivered  Discharge Information: Date: 02/12/2017 Activity: pelvic rest Diet: routine Medications: PNV, Ibuprofen and Augmentin and Tessalon Condition: stable Instructions: refer to practice specific booklet Discharge to: home   Newborn Data: Live born female  Birth Weight: 8 lb 4.5 oz (3755 g) APGAR: 9, 9  Home with mother.  Kathryn Roberts G 02/12/2017, 7:56 AM

## 2017-02-13 ENCOUNTER — Encounter (HOSPITAL_COMMUNITY)
Admission: RE | Admit: 2017-02-13 | Discharge: 2017-02-13 | Disposition: A | Discharge status: Home / Self Care | Payer: BLUE CROSS/BLUE SHIELD | Source: Ambulatory Visit

## 2017-02-14 ENCOUNTER — Encounter (HOSPITAL_COMMUNITY): Admission: RE | Payer: Self-pay | Source: Ambulatory Visit

## 2017-02-14 ENCOUNTER — Inpatient Hospital Stay (HOSPITAL_COMMUNITY)
Admission: RE | Admit: 2017-02-14 | Payer: BLUE CROSS/BLUE SHIELD | Source: Ambulatory Visit | Admitting: Obstetrics and Gynecology

## 2017-02-14 SURGERY — Surgical Case
Anesthesia: Regional

## 2017-03-26 DIAGNOSIS — Z1389 Encounter for screening for other disorder: Secondary | ICD-10-CM | POA: Diagnosis not present

## 2017-06-05 DIAGNOSIS — E663 Overweight: Secondary | ICD-10-CM | POA: Diagnosis not present

## 2017-06-05 DIAGNOSIS — L732 Hidradenitis suppurativa: Secondary | ICD-10-CM | POA: Diagnosis not present

## 2017-06-05 DIAGNOSIS — Z6829 Body mass index (BMI) 29.0-29.9, adult: Secondary | ICD-10-CM | POA: Diagnosis not present

## 2017-08-28 DIAGNOSIS — Z0000 Encounter for general adult medical examination without abnormal findings: Secondary | ICD-10-CM | POA: Diagnosis not present

## 2017-10-09 DIAGNOSIS — J069 Acute upper respiratory infection, unspecified: Secondary | ICD-10-CM | POA: Diagnosis not present

## 2017-11-28 DIAGNOSIS — R32 Unspecified urinary incontinence: Secondary | ICD-10-CM | POA: Diagnosis not present

## 2018-01-20 DIAGNOSIS — Z713 Dietary counseling and surveillance: Secondary | ICD-10-CM | POA: Diagnosis not present

## 2018-03-17 DIAGNOSIS — Z713 Dietary counseling and surveillance: Secondary | ICD-10-CM | POA: Diagnosis not present

## 2018-03-19 DIAGNOSIS — R300 Dysuria: Secondary | ICD-10-CM | POA: Diagnosis not present

## 2018-03-19 DIAGNOSIS — N3090 Cystitis, unspecified without hematuria: Secondary | ICD-10-CM | POA: Diagnosis not present

## 2018-07-29 DIAGNOSIS — F418 Other specified anxiety disorders: Secondary | ICD-10-CM | POA: Diagnosis not present

## 2018-07-29 DIAGNOSIS — F410 Panic disorder [episodic paroxysmal anxiety] without agoraphobia: Secondary | ICD-10-CM | POA: Diagnosis not present

## 2018-08-25 ENCOUNTER — Other Ambulatory Visit: Payer: Self-pay | Admitting: Radiology

## 2018-08-25 DIAGNOSIS — R399 Unspecified symptoms and signs involving the genitourinary system: Secondary | ICD-10-CM | POA: Diagnosis not present

## 2018-08-25 DIAGNOSIS — N6332 Unspecified lump in axillary tail of the left breast: Secondary | ICD-10-CM | POA: Diagnosis not present

## 2018-08-25 DIAGNOSIS — N938 Other specified abnormal uterine and vaginal bleeding: Secondary | ICD-10-CM | POA: Diagnosis not present

## 2018-08-25 DIAGNOSIS — N6320 Unspecified lump in the left breast, unspecified quadrant: Secondary | ICD-10-CM

## 2018-08-25 DIAGNOSIS — Z3202 Encounter for pregnancy test, result negative: Secondary | ICD-10-CM | POA: Diagnosis not present

## 2018-08-25 DIAGNOSIS — D509 Iron deficiency anemia, unspecified: Secondary | ICD-10-CM | POA: Diagnosis not present

## 2018-09-01 ENCOUNTER — Other Ambulatory Visit: Payer: BLUE CROSS/BLUE SHIELD

## 2018-09-07 ENCOUNTER — Ambulatory Visit
Admission: RE | Admit: 2018-09-07 | Discharge: 2018-09-07 | Disposition: A | Discharge status: Home / Self Care | Payer: Self-pay | Source: Ambulatory Visit | Attending: Radiology | Admitting: Radiology

## 2018-09-07 ENCOUNTER — Ambulatory Visit
Admission: RE | Admit: 2018-09-07 | Discharge: 2018-09-07 | Disposition: A | Discharge status: Home / Self Care | Payer: BLUE CROSS/BLUE SHIELD | Source: Ambulatory Visit | Attending: Radiology | Admitting: Radiology

## 2018-09-07 ENCOUNTER — Other Ambulatory Visit: Payer: Self-pay | Admitting: Radiology

## 2018-09-07 DIAGNOSIS — N6489 Other specified disorders of breast: Secondary | ICD-10-CM | POA: Diagnosis not present

## 2018-09-07 DIAGNOSIS — R922 Inconclusive mammogram: Secondary | ICD-10-CM | POA: Diagnosis not present

## 2018-09-07 DIAGNOSIS — N6320 Unspecified lump in the left breast, unspecified quadrant: Secondary | ICD-10-CM

## 2018-09-10 DIAGNOSIS — Z1389 Encounter for screening for other disorder: Secondary | ICD-10-CM | POA: Diagnosis not present

## 2018-09-10 DIAGNOSIS — Z6823 Body mass index (BMI) 23.0-23.9, adult: Secondary | ICD-10-CM | POA: Diagnosis not present

## 2018-09-10 DIAGNOSIS — K589 Irritable bowel syndrome without diarrhea: Secondary | ICD-10-CM | POA: Diagnosis not present

## 2018-09-10 DIAGNOSIS — Z0000 Encounter for general adult medical examination without abnormal findings: Secondary | ICD-10-CM | POA: Diagnosis not present

## 2018-09-10 DIAGNOSIS — J329 Chronic sinusitis, unspecified: Secondary | ICD-10-CM | POA: Diagnosis not present

## 2018-11-09 DIAGNOSIS — Z6823 Body mass index (BMI) 23.0-23.9, adult: Secondary | ICD-10-CM | POA: Diagnosis not present

## 2018-11-09 DIAGNOSIS — Z01419 Encounter for gynecological examination (general) (routine) without abnormal findings: Secondary | ICD-10-CM | POA: Diagnosis not present

## 2018-12-03 DIAGNOSIS — H18832 Recurrent erosion of cornea, left eye: Secondary | ICD-10-CM | POA: Diagnosis not present

## 2018-12-10 DIAGNOSIS — H18832 Recurrent erosion of cornea, left eye: Secondary | ICD-10-CM | POA: Diagnosis not present

## 2018-12-17 DIAGNOSIS — Z1283 Encounter for screening for malignant neoplasm of skin: Secondary | ICD-10-CM | POA: Diagnosis not present

## 2018-12-17 DIAGNOSIS — L570 Actinic keratosis: Secondary | ICD-10-CM | POA: Diagnosis not present

## 2018-12-17 DIAGNOSIS — X32XXXA Exposure to sunlight, initial encounter: Secondary | ICD-10-CM | POA: Diagnosis not present

## 2018-12-17 DIAGNOSIS — D225 Melanocytic nevi of trunk: Secondary | ICD-10-CM | POA: Diagnosis not present

## 2019-01-07 DIAGNOSIS — H18832 Recurrent erosion of cornea, left eye: Secondary | ICD-10-CM | POA: Diagnosis not present

## 2019-04-06 DIAGNOSIS — H18832 Recurrent erosion of cornea, left eye: Secondary | ICD-10-CM | POA: Diagnosis not present

## 2019-05-04 DIAGNOSIS — H18832 Recurrent erosion of cornea, left eye: Secondary | ICD-10-CM | POA: Diagnosis not present

## 2019-05-10 DIAGNOSIS — J329 Chronic sinusitis, unspecified: Secondary | ICD-10-CM | POA: Diagnosis not present

## 2019-05-18 DIAGNOSIS — H18832 Recurrent erosion of cornea, left eye: Secondary | ICD-10-CM | POA: Diagnosis not present

## 2019-05-27 DIAGNOSIS — Z6823 Body mass index (BMI) 23.0-23.9, adult: Secondary | ICD-10-CM | POA: Diagnosis not present

## 2019-05-27 DIAGNOSIS — R5383 Other fatigue: Secondary | ICD-10-CM | POA: Diagnosis not present

## 2019-06-28 DIAGNOSIS — R21 Rash and other nonspecific skin eruption: Secondary | ICD-10-CM | POA: Diagnosis not present

## 2019-11-18 DIAGNOSIS — R5383 Other fatigue: Secondary | ICD-10-CM | POA: Diagnosis not present

## 2019-11-18 DIAGNOSIS — N920 Excessive and frequent menstruation with regular cycle: Secondary | ICD-10-CM | POA: Diagnosis not present

## 2019-11-18 DIAGNOSIS — Z6823 Body mass index (BMI) 23.0-23.9, adult: Secondary | ICD-10-CM | POA: Diagnosis not present

## 2019-11-18 DIAGNOSIS — Z01419 Encounter for gynecological examination (general) (routine) without abnormal findings: Secondary | ICD-10-CM | POA: Diagnosis not present

## 2019-11-18 DIAGNOSIS — R6882 Decreased libido: Secondary | ICD-10-CM | POA: Diagnosis not present

## 2020-01-26 ENCOUNTER — Other Ambulatory Visit (INDEPENDENT_AMBULATORY_CARE_PROVIDER_SITE_OTHER): Payer: BC Managed Care – PPO

## 2020-01-26 ENCOUNTER — Ambulatory Visit (INDEPENDENT_AMBULATORY_CARE_PROVIDER_SITE_OTHER): Payer: BC Managed Care – PPO | Admitting: Physician Assistant

## 2020-01-26 ENCOUNTER — Encounter: Payer: Self-pay | Admitting: Physician Assistant

## 2020-01-26 VITALS — BP 118/70 | HR 67 | Temp 97.7°F | Ht 63.0 in | Wt 135.0 lb

## 2020-01-26 DIAGNOSIS — R109 Unspecified abdominal pain: Secondary | ICD-10-CM | POA: Diagnosis not present

## 2020-01-26 DIAGNOSIS — K529 Noninfective gastroenteritis and colitis, unspecified: Secondary | ICD-10-CM

## 2020-01-26 LAB — C-REACTIVE PROTEIN: CRP: <1 mg/dL (ref 0.5–20.0)

## 2020-01-26 LAB — CBC WITH DIFFERENTIAL/PLATELET
Basophils Absolute: 0 10*3/uL (ref 0.0–0.1)
Basophils Relative: 0.4 % (ref 0.0–3.0)
Eosinophils Absolute: 0.1 10*3/uL (ref 0.0–0.7)
Eosinophils Relative: 1.6 % (ref 0.0–5.0)
HCT: 39.9 % (ref 36.0–46.0)
Hemoglobin: 13.7 g/dL (ref 12.0–15.0)
Lymphocytes Relative: 31.1 % (ref 12.0–46.0)
Lymphs Abs: 2 10*3/uL (ref 0.7–4.0)
MCHC: 34.3 g/dL (ref 30.0–36.0)
MCV: 94.8 fl (ref 78.0–100.0)
Monocytes Absolute: 0.5 10*3/uL (ref 0.1–1.0)
Monocytes Relative: 7.4 % (ref 3.0–12.0)
Neutro Abs: 3.7 10*3/uL (ref 1.4–7.7)
Neutrophils Relative %: 59.5 % (ref 43.0–77.0)
Platelets: 228 10*3/uL (ref 150.0–400.0)
RBC: 4.21 Mil/uL (ref 3.87–5.11)
RDW: 12.2 % (ref 11.5–15.5)
WBC: 6.3 10*3/uL (ref 4.0–10.5)

## 2020-01-26 LAB — COMPREHENSIVE METABOLIC PANEL
ALT: 11 U/L (ref 0–35)
AST: 13 U/L (ref 0–37)
Albumin: 4.5 g/dL (ref 3.5–5.2)
Alkaline Phosphatase: 43 U/L (ref 39–117)
BUN: 12 mg/dL (ref 6–23)
CO2: 27 mEq/L (ref 19–32)
Calcium: 9.1 mg/dL (ref 8.4–10.5)
Chloride: 104 mEq/L (ref 96–112)
Creatinine, Ser: 0.69 mg/dL (ref 0.40–1.20)
GFR: 96.22 mL/min (ref >60.00)
Glucose, Bld: 91 mg/dL (ref 70–99)
Potassium: 4.1 mEq/L (ref 3.5–5.1)
Sodium: 137 mEq/L (ref 135–145)
Total Bilirubin: 0.6 mg/dL (ref 0.2–1.2)
Total Protein: 6.8 g/dL (ref 6.0–8.3)

## 2020-01-26 LAB — IGA: IgA: 254 mg/dL (ref 68–378)

## 2020-01-26 LAB — LIPASE: Lipase: 21 U/L (ref 11.0–59.0)

## 2020-01-26 LAB — SEDIMENTATION RATE: Sed Rate: 5 mm/hr (ref 0–20)

## 2020-01-26 MED ORDER — DICYCLOMINE HCL 10 MG PO CAPS: 10.0000 mg | ORAL_CAPSULE | Freq: Two times a day (BID) | ORAL | 6 refills | Status: DC

## 2020-01-26 NOTE — Patient Instructions (Addendum)
If you are age 36 or older, your body mass index should be between 23-30. Your Body mass index is 23.91 kg/m. If this is out of the aforementioned range listed, please consider follow up with your Primary Care Provider.  If you are age 36 or younger, your body mass index should be between 19-25. Your Body mass index is 23.91 kg/m. If this is out of the aformentioned range listed, please consider follow up with your Primary Care Provider.   You have been scheduled for a CT scan of the abdomen and pelvis at [facility] ([address]---this is in the same building as [employer]).   You are scheduled on 02/01/20 at 1:00 pm. You should arrive 15 minutes prior to your appointment time for registration. Please follow the written instructions below on the day of your exam:  WARNING: IF YOU ARE ALLERGIC TO IODINE/X-RAY DYE, PLEASE NOTIFY RADIOLOGY IMMEDIATELY AT [phone]! YOU WILL BE GIVEN A 13 HOUR PREMEDICATION PREP.  1) Do not eat or drink anything after 9:00 am (4 hours prior to your test) 2) You have been given 2 bottles of oral contrast to drink. The solution may taste better if refrigerated, but do NOT add ice or any other liquid to this solution. Shake well before drinking.    Drink 1 bottle of contrast @ 11:00 am (2 hours prior to your exam)  Drink 1 bottle of contrast @ 12:00 pm (1 hour prior to your exam)  You may take any medications as prescribed with a small amount of water, if necessary. If you take any of the following medications: METFORMIN, GLUCOPHAGE, GLUCOVANCE, AVANDAMET, RIOMET, FORTAMET, [place] MET, JANUMET, GLUMETZA or METAGLIP, you MAY be asked to HOLD this medication 48 hours AFTER the exam.  The purpose of you drinking the oral contrast is to aid in the visualization of your intestinal tract. The contrast solution may cause some diarrhea. Depending on your individual set of symptoms, you may also receive an intravenous injection of x-ray  contrast/dye. Plan on being at [facility] for 30 minutes or longer, depending on the type of exam you are having performed.  This test typically takes 30-45 minutes to complete.  If you have any questions regarding your exam or if you need to reschedule, you may call the CT department at [phone] between the hours of 8:00 am and 5:00 pm, Monday-Friday.  ________________________________________________________________________  Your provider has requested that you go to the basement level for lab work before leaving today. Press "B" on the elevator. The lab is located at the first door on the left as you exit the elevator.  Due to recent changes in healthcare laws, you may see the results of your imaging and laboratory studies on MyChart before your provider has had a chance to review them.  We understand that in some cases there may be results that are confusing or concerning to you. Not all laboratory results come back in the same time frame and the provider may be waiting for multiple results in order to interpret others.  Please give [place] 48 hours in order for your provider to thoroughly review all the results before contacting the office for clarification of your results.   Bentyl 10 mg 1 tablet by mouth twice daily 30 minutes before lunch and dinner. This has been sent in to your pharmacy.  Follow up pending results of labs and CT.

## 2020-01-27 ENCOUNTER — Telehealth: Payer: Self-pay | Admitting: Physician Assistant

## 2020-01-27 LAB — TISSUE TRANSGLUTAMINASE, IGA: (tTG) Ab, IgA: 1 U/mL

## 2020-01-27 NOTE — Telephone Encounter (Signed)
Patients requesting lab results

## 2020-01-27 NOTE — Telephone Encounter (Signed)
Left message that her results show be visible in her My Chart account. I will call as soon as the provider has reviewed them. Amy Trellis Paganini is out of the office today.

## 2020-01-30 ENCOUNTER — Encounter: Payer: Self-pay | Admitting: Physician Assistant

## 2020-01-30 NOTE — Progress Notes (Signed)
Subjective:    Patient ID: Kathryn Roberts, female    DOB: January 06, 1986, 34 y.o.   MRN: [id number]  HPI  Kathryn Roberts is a pleasant 34 year old white female, established with Dr. Carlean Purl who was last seen in 2017 when she underwent colonoscopy.  She comes in today due to abdominal pain and change in bowel habits with diarrhea. Patient has history of adenomatous colon polyps, and a colonoscopy in 2017 had a 2 mm cecal polyp removed which was a tubular adenoma and is indicated for 5-year interval follow-up. She does have history of IBS, hemorrhoids and is status post remote cholecystectomy. She says her current symptoms have been present over the past 3 weeks and feel different than her usual IBS type symptoms.  She has consistently been having crampy abdominal pain and bloating that develops every afternoon a couple hours after eating lunch.  She is also been having 4-5 loose to diarrheal stools per day, somewhat urgent.  She has had some issues with loose stools for years dating back prior to having gallbladder surgery. She tried eliminating multiple foods over the past couple of weeks without any changes in symptoms.  She is not sure whether or not she is lactose intolerant and would like to be tested.  She does not use any artificial sweeteners, no carbonates and no family history of celiac disease. No nausea or vomiting though she does complain of the crampy pain in the mid to upper abdomen and describes a knot-like sensation in the upper abdomen at times.  Weight has been stable, no fevers.  She has seen some small-volume bright red blood just on the tissue.  Review of Systems Pertinent positive and negative review of systems were noted in the above HPI section.  All other review of systems was otherwise negative.  Outpatient Encounter Medications as of 01/26/2020  Medication Sig  . ALPRAZolam (XANAX) 0.5 MG tablet Take 0.5 mg by mouth as needed for anxiety.  . dicyclomine (BENTYL) 10 MG capsule Take 1  capsule (10 mg total) by mouth 2 (two) times daily.  . [DISCONTINUED] amoxicillin-clavulanate (AUGMENTIN) 875-125 MG tablet Take 1 tablet by mouth every 12 (twelve) hours.  . [DISCONTINUED] benzonatate (TESSALON) 200 MG capsule Take 1 capsule (200 mg total) by mouth 3 (three) times daily.  . [DISCONTINUED] ibuprofen (ADVIL,MOTRIN) 600 MG tablet Take 1 tablet (600 mg total) by mouth every 6 (six) hours.  . [DISCONTINUED] Prenatal Vit-Fe Fumarate-FA (PRENATAL MULTIVITAMIN) TABS tablet Take 1 tablet by mouth daily at 12 noon.   No facility-administered encounter medications on file as of 01/26/2020.   Allergies  Allergen Reactions  . Codeine Nausea Only  . Hydrocodone-Acetaminophen Nausea Only   Patient Active Problem List   Diagnosis Date Noted  . Cesarean delivery delivered 02/10/2017  . Stress incontinence in female 10/10/2014  . HEMORRHOIDS, WITH BLEEDING 03/28/2010  . IRRITABLE BOWEL SYNDROME 03/28/2010  . Personal history of colonic polyps 11/15/2005   Social History   Socioeconomic History  . Marital status: Married    Spouse name: Not on file  . Number of children: Not on file  . Years of education: Not on file  . Highest education level: Not on file  Occupational History  . Not on file  Tobacco Use  . Smoking status: Never Smoker  . Smokeless tobacco: Never Used  Substance and Sexual Activity  . Alcohol use: Yes    Alcohol/week: 7.0 standard drinks    Types: 7 Glasses of wine per week  Comment: one wine daily  . Drug use: No  . Sexual activity: Yes    Birth control/protection: None  Other Topics Concern  . Not on file  Social History Narrative  . Not on file   Social Determinants of Health   Financial Resource Strain:   . Difficulty of Paying Living Expenses:   Food Insecurity:   . Worried About Charity fundraiser in the Last Year:   . Arboriculturist in the Last Year:   Transportation Needs:   . Film/video editor (Medical):   Marland Kitchen Lack of  Transportation (Non-Medical):   Physical Activity:   . Days of Exercise per Week:   . Minutes of Exercise per Session:   Stress:   . Feeling of Stress :   Social Connections:   . Frequency of Communication with Friends and Family:   . Frequency of Social Gatherings with Friends and Family:   . Attends Religious Services:   . Active Member of Clubs or Organizations:   . Attends Archivist Meetings:   Marland Kitchen Marital Status:   Intimate Partner Violence:   . Fear of Current or Ex-Partner:   . Emotionally Abused:   Marland Kitchen Physically Abused:   . Sexually Abused:     Ms. Kercher family history includes Alcohol abuse in her brother; Cancer in her cousin and maternal grandfather; Diabetes in her maternal grandmother, maternal uncle, paternal aunt, paternal grandmother, paternal uncle, and paternal uncle; Heart disease in her paternal grandmother; Hypertension in her maternal grandfather, maternal grandmother, and mother; Seizures in her paternal uncle.      Objective:    Vitals:   01/26/20 1041  BP: 118/70  Pulse: 67  Temp: 97.7 F (36.5 C)    Physical Exam Well-developed well-nourished W female in no acute distress.  Height, Weight,135 BMI 23.9  HEENT; nontraumatic normocephalic, EOMI, PER R LA, sclera anicteric. Oropharynx;not examined Neck; supple, no JVD Cardiovascular; regular rate and rhythm with S1-S2, no murmur rub or gallop Pulmonary; Clear bilaterally Abdomen; soft, she is tender in the hypogastrium, some fullness in upper abdomen , no palp hernia, nondistended, no palpable mass or hepatosplenomegaly, bowel sounds are active Rectal; not done Skin; benign exam, no jaundice rash or appreciable lesions Extremities; no clubbing cyanosis or edema skin warm and dry Neuro/Psych; alert and oriented x4, grossly nonfocal mood and affect appropriate       Assessment & Plan:   #34  Space 34 year old white female with 3-week history of mid to upper abdominal crampy pain usually  occurring daily a couple of hours after eating lunch.  He has also been having 4-5 diarrheal stools per day, sometimes urgent. No nausea or vomiting, no fevers, no weight loss. Patient with history of IBS but symptoms feel different. She is status post cholecystectomy, says her chronic intermittent issues with loose stools predates the cholecystectomy. Etiology of current symptoms is not clear.  Rule out IBS, rule out IBD, rule out low-grade partial obstruction.  Rule out intra-abdominal inflammatory process. Will also check for celiac disease and lactose intolerance though doubt accounting for current symptoms.  #2 history of adenomatous colon polyps-up-to-date with colonoscopy last done May 2017, indicated for 5-year interval follow-up  Plan CBC with differential, sed rate, CRP, c-Met, TTG and IgA. CT scan of the abdomen and pelvis with contrast. Breath testing for lactose intolerance. Start trial of Bentyl 10 mg p.o. twice daily to 3 times daily. Further plans/recommendations pending results of above.  Nishka Heide S Larcenia Holaday PA-C  01/30/2020   Cc: Elsie Lincoln, MD

## 2020-02-01 ENCOUNTER — Other Ambulatory Visit: Payer: Self-pay

## 2020-02-01 ENCOUNTER — Ambulatory Visit (INDEPENDENT_AMBULATORY_CARE_PROVIDER_SITE_OTHER)
Admission: RE | Admit: 2020-02-01 | Discharge: 2020-02-01 | Disposition: A | Discharge status: Home / Self Care | Payer: BC Managed Care – PPO | Source: Ambulatory Visit | Attending: Physician Assistant | Admitting: Physician Assistant

## 2020-02-01 DIAGNOSIS — R109 Unspecified abdominal pain: Secondary | ICD-10-CM

## 2020-02-01 DIAGNOSIS — K529 Noninfective gastroenteritis and colitis, unspecified: Secondary | ICD-10-CM

## 2020-02-01 DIAGNOSIS — R197 Diarrhea, unspecified: Secondary | ICD-10-CM | POA: Diagnosis not present

## 2020-02-01 MED ORDER — IOHEXOL 300 MG/ML  SOLN
100.0000 mL | Freq: Once | INTRAMUSCULAR | Status: AC | PRN
  Administered 2020-02-01: 100 mL via INTRAVENOUS

## 2020-02-08 DIAGNOSIS — N393 Stress incontinence (female) (male): Secondary | ICD-10-CM | POA: Diagnosis not present

## 2020-02-08 DIAGNOSIS — N200 Calculus of kidney: Secondary | ICD-10-CM | POA: Diagnosis not present

## 2020-02-29 DIAGNOSIS — Z20828 Contact with and (suspected) exposure to other viral communicable diseases: Secondary | ICD-10-CM | POA: Diagnosis not present

## 2020-02-29 DIAGNOSIS — N3946 Mixed incontinence: Secondary | ICD-10-CM | POA: Diagnosis not present

## 2020-02-29 DIAGNOSIS — M6281 Muscle weakness (generalized): Secondary | ICD-10-CM | POA: Diagnosis not present

## 2020-02-29 DIAGNOSIS — M6289 Other specified disorders of muscle: Secondary | ICD-10-CM | POA: Diagnosis not present

## 2020-02-29 DIAGNOSIS — M62838 Other muscle spasm: Secondary | ICD-10-CM | POA: Diagnosis not present

## 2020-02-29 DIAGNOSIS — Z03818 Encounter for observation for suspected exposure to other biological agents ruled out: Secondary | ICD-10-CM | POA: Diagnosis not present

## 2020-03-10 DIAGNOSIS — N393 Stress incontinence (female) (male): Secondary | ICD-10-CM | POA: Diagnosis not present

## 2020-03-10 DIAGNOSIS — M62838 Other muscle spasm: Secondary | ICD-10-CM | POA: Diagnosis not present

## 2020-03-10 DIAGNOSIS — M6281 Muscle weakness (generalized): Secondary | ICD-10-CM | POA: Diagnosis not present

## 2020-03-10 DIAGNOSIS — M6289 Other specified disorders of muscle: Secondary | ICD-10-CM | POA: Diagnosis not present

## 2020-03-16 DIAGNOSIS — N3946 Mixed incontinence: Secondary | ICD-10-CM | POA: Diagnosis not present

## 2020-03-16 DIAGNOSIS — M62838 Other muscle spasm: Secondary | ICD-10-CM | POA: Diagnosis not present

## 2020-03-16 DIAGNOSIS — M6289 Other specified disorders of muscle: Secondary | ICD-10-CM | POA: Diagnosis not present

## 2020-03-16 DIAGNOSIS — M6281 Muscle weakness (generalized): Secondary | ICD-10-CM | POA: Diagnosis not present

## 2020-03-24 DIAGNOSIS — M6289 Other specified disorders of muscle: Secondary | ICD-10-CM | POA: Diagnosis not present

## 2020-03-24 DIAGNOSIS — N393 Stress incontinence (female) (male): Secondary | ICD-10-CM | POA: Diagnosis not present

## 2020-03-24 DIAGNOSIS — M6281 Muscle weakness (generalized): Secondary | ICD-10-CM | POA: Diagnosis not present

## 2020-03-24 DIAGNOSIS — M62838 Other muscle spasm: Secondary | ICD-10-CM | POA: Diagnosis not present

## 2020-05-15 DIAGNOSIS — M6281 Muscle weakness (generalized): Secondary | ICD-10-CM | POA: Diagnosis not present

## 2020-05-15 DIAGNOSIS — M62838 Other muscle spasm: Secondary | ICD-10-CM | POA: Diagnosis not present

## 2020-05-15 DIAGNOSIS — M6289 Other specified disorders of muscle: Secondary | ICD-10-CM | POA: Diagnosis not present

## 2020-05-15 DIAGNOSIS — N393 Stress incontinence (female) (male): Secondary | ICD-10-CM | POA: Diagnosis not present

## 2020-06-12 DIAGNOSIS — Z681 Body mass index (BMI) 19 or less, adult: Secondary | ICD-10-CM | POA: Diagnosis not present

## 2020-06-12 DIAGNOSIS — J329 Chronic sinusitis, unspecified: Secondary | ICD-10-CM | POA: Diagnosis not present

## 2020-06-19 DIAGNOSIS — U071 COVID-19: Secondary | ICD-10-CM | POA: Diagnosis not present

## 2020-06-19 DIAGNOSIS — Z681 Body mass index (BMI) 19 or less, adult: Secondary | ICD-10-CM | POA: Diagnosis not present

## 2020-06-20 ENCOUNTER — Encounter: Payer: Self-pay | Admitting: Oncology

## 2020-06-20 ENCOUNTER — Other Ambulatory Visit: Payer: Self-pay | Admitting: Oncology

## 2020-06-20 ENCOUNTER — Ambulatory Visit (HOSPITAL_COMMUNITY)
Admission: RE | Admit: 2020-06-20 | Discharge: 2020-06-20 | Disposition: A | Discharge status: Home / Self Care | Payer: BC Managed Care – PPO | Source: Ambulatory Visit | Attending: Pulmonary Disease | Admitting: Pulmonary Disease

## 2020-06-20 DIAGNOSIS — E669 Obesity, unspecified: Secondary | ICD-10-CM | POA: Diagnosis not present

## 2020-06-20 DIAGNOSIS — U071 COVID-19: Secondary | ICD-10-CM | POA: Diagnosis not present

## 2020-06-20 DIAGNOSIS — Z6825 Body mass index (BMI) 25.0-25.9, adult: Secondary | ICD-10-CM | POA: Diagnosis not present

## 2020-06-20 MED ORDER — SODIUM CHLORIDE 0.9 % IV SOLN: INTRAVENOUS | Status: DC | PRN

## 2020-06-20 MED ORDER — EPINEPHRINE 0.3 MG/0.3ML IJ SOAJ: 0.3000 mg | Freq: Once | INTRAMUSCULAR | Status: DC | PRN

## 2020-06-20 MED ORDER — METHYLPREDNISOLONE SODIUM SUCC 125 MG IJ SOLR: 125.0000 mg | Freq: Once | INTRAMUSCULAR | Status: DC | PRN

## 2020-06-20 MED ORDER — FAMOTIDINE IN NACL 20-0.9 MG/50ML-% IV SOLN: 20.0000 mg | Freq: Once | INTRAVENOUS | Status: DC | PRN

## 2020-06-20 MED ORDER — DIPHENHYDRAMINE HCL 50 MG/ML IJ SOLN: 50.0000 mg | Freq: Once | INTRAMUSCULAR | Status: DC | PRN

## 2020-06-20 MED ORDER — SODIUM CHLORIDE 0.9 % IV SOLN
1200.0000 mg | Freq: Once | INTRAVENOUS | Status: AC
  Administered 2020-06-20: 1200 mg via INTRAVENOUS
  Filled 2020-06-20: qty 1200

## 2020-06-20 MED ORDER — ALBUTEROL SULFATE HFA 108 (90 BASE) MCG/ACT IN AERS: 2.0000 | INHALATION_SPRAY | Freq: Once | RESPIRATORY_TRACT | Status: DC | PRN

## 2020-06-20 NOTE — Progress Notes (Signed)
  Diagnosis: COVID-19  Physician: Dr. Joya Gaskins  Procedure: [facility] Infusion [facility] Med: casirivimab\imdevimab infusion - Provided patient with casirivimab\imdevimab fact sheet for patients, parents and caregivers prior to infusion.  Complications: No immediate complications noted.  Discharge: Discharged home   [place] 06/20/2020

## 2020-06-20 NOTE — Discharge Instructions (Signed)

## 2020-06-20 NOTE — Progress Notes (Signed)
I connected by phone with  Kathryn Roberts on 06/20/20 at 3:20pm  to discuss the potential use of an new treatment for mild to moderate COVID-19 viral infection in non-hospitalized patients.   This patient is a age/sex that meets the FDA criteria for Emergency Use Authorization of casirivimab\imdevimab.  Has a (+) direct SARS-CoV-2 viral test result 1. Has mild or moderate COVID-19  2. Is ? 36 years of age and weighs ? 40 kg 3. Is NOT hospitalized due to COVID-19 4. Is NOT requiring oxygen therapy or requiring an increase in baseline oxygen flow rate due to COVID-19 5. Is within 10 days of symptom onset 6. Has at least one of the high risk factor(s) for progression to severe COVID-19 and/or hospitalization as defined in EUA. ? Specific high risk criteria :Obesity    Symptom onset  06/11/20.   I have spoken and communicated the following to the patient or parent/caregiver:   1. FDA has authorized the emergency use of casirivimab\imdevimab for the treatment of mild to moderate COVID-19 in adults and pediatric patients with positive results of direct SARS-CoV-2 viral testing who are 56 years of age and older weighing at least 40 kg, and who are at high risk for progressing to severe COVID-19 and/or hospitalization.   2. The significant known and potential risks and benefits of casirivimab\imdevimab, and the extent to which such potential risks and benefits are unknown.   3. Information on available alternative treatments and the risks and benefits of those alternatives, including clinical trials.   4. Patients treated with casirivimab\imdevimab should continue to self-isolate and use infection control measures (e.g., wear mask, isolate, social distance, avoid sharing personal items, clean and disinfect "high touch" surfaces, and frequent handwashing) according to CDC guidelines.    5. The patient or parent/caregiver has the option to accept or refuse casirivimab\imdevimab .   After reviewing this  information with the patient, The patient agreed to proceed with receiving casirivimab\imdevimab infusion and will be provided a copy of the Fact sheet prior to receiving the infusion.Rulon Abide, AGNP-C [phone] ([place])

## 2020-06-23 ENCOUNTER — Emergency Department (HOSPITAL_BASED_OUTPATIENT_CLINIC_OR_DEPARTMENT_OTHER): Payer: BC Managed Care – PPO

## 2020-06-23 ENCOUNTER — Other Ambulatory Visit: Payer: Self-pay

## 2020-06-23 ENCOUNTER — Encounter (HOSPITAL_BASED_OUTPATIENT_CLINIC_OR_DEPARTMENT_OTHER): Payer: Self-pay | Admitting: *Deleted

## 2020-06-23 ENCOUNTER — Emergency Department (HOSPITAL_BASED_OUTPATIENT_CLINIC_OR_DEPARTMENT_OTHER)
Admission: EM | Admit: 2020-06-23 | Discharge: 2020-06-23 | Disposition: A | Discharge status: Home / Self Care | Payer: BC Managed Care – PPO | Attending: Emergency Medicine | Admitting: Emergency Medicine

## 2020-06-23 DIAGNOSIS — Z86010 Personal history of colonic polyps: Secondary | ICD-10-CM | POA: Insufficient documentation

## 2020-06-23 DIAGNOSIS — E860 Dehydration: Secondary | ICD-10-CM

## 2020-06-23 DIAGNOSIS — J9811 Atelectasis: Secondary | ICD-10-CM | POA: Diagnosis not present

## 2020-06-23 DIAGNOSIS — U071 COVID-19: Secondary | ICD-10-CM | POA: Insufficient documentation

## 2020-06-23 DIAGNOSIS — R079 Chest pain, unspecified: Secondary | ICD-10-CM | POA: Diagnosis not present

## 2020-06-23 DIAGNOSIS — R110 Nausea: Secondary | ICD-10-CM | POA: Diagnosis not present

## 2020-06-23 DIAGNOSIS — R0602 Shortness of breath: Secondary | ICD-10-CM | POA: Diagnosis not present

## 2020-06-23 DIAGNOSIS — R05 Cough: Secondary | ICD-10-CM | POA: Diagnosis not present

## 2020-06-23 DIAGNOSIS — R112 Nausea with vomiting, unspecified: Secondary | ICD-10-CM | POA: Diagnosis not present

## 2020-06-23 LAB — CBC
HCT: 37.5 % (ref 36.0–46.0)
Hemoglobin: 12.6 g/dL (ref 12.0–15.0)
MCH: 31.7 pg (ref 26.0–34.0)
MCHC: 33.6 g/dL (ref 30.0–36.0)
MCV: 94.2 fL (ref 80.0–100.0)
Platelets: 151 10*3/uL (ref 150–400)
RBC: 3.98 MIL/uL (ref 3.87–5.11)
RDW: 11.9 % (ref 11.5–15.5)
WBC: 4.7 10*3/uL (ref 4.0–10.5)
nRBC: 0 % (ref 0.0–0.2)

## 2020-06-23 LAB — COMPREHENSIVE METABOLIC PANEL
ALT: 25 U/L (ref 0–44)
AST: 28 U/L (ref 15–41)
Albumin: 3.5 g/dL (ref 3.5–5.0)
Alkaline Phosphatase: 37 U/L — ABNORMAL LOW (ref 38–126)
Anion gap: 10 (ref 5–15)
BUN: 10 mg/dL (ref 6–20)
CO2: 27 mmol/L (ref 22–32)
Calcium: 8.4 mg/dL — ABNORMAL LOW (ref 8.9–10.3)
Chloride: 103 mmol/L (ref 98–111)
Creatinine, Ser: 0.6 mg/dL (ref 0.44–1.00)
GFR calc Af Amer: >60 mL/min (ref >60)
GFR calc non Af Amer: >60 mL/min (ref >60)
Glucose, Bld: 97 mg/dL (ref 70–99)
Potassium: 3.6 mmol/L (ref 3.5–5.1)
Sodium: 140 mmol/L (ref 135–145)
Total Bilirubin: 0.4 mg/dL (ref 0.3–1.2)
Total Protein: 6.7 g/dL (ref 6.5–8.1)

## 2020-06-23 MED ORDER — SODIUM CHLORIDE 0.9 % IV BOLUS
1000.0000 mL | Freq: Once | INTRAVENOUS | Status: AC
  Administered 2020-06-23: 1000 mL via INTRAVENOUS

## 2020-06-23 MED ORDER — ONDANSETRON 4 MG PO TBDP: 4.0000 mg | ORAL_TABLET | Freq: Three times a day (TID) | ORAL | 0 refills | Status: DC | PRN

## 2020-06-23 MED FILL — ONDANSETRON ODT 4 MG TABLET: 4 | 6 days supply | Qty: 20 | Fill #0

## 2020-06-23 NOTE — ED Provider Notes (Signed)
[facility] Emergency Department Provider Note MRN:  [id number]  Arrival date & time: 06/23/20     Chief Complaint   Cough (covid +)   History of Present Illness   Kathryn Roberts is a 34 y.o. year-old female with a history of GERD presenting to the ED with chief complaint of cough.  Patient is Covid positive and has been having symptoms for 2 weeks.  Began with mild low-grade fever, body aches, has progressively worsened with similar but more severe symptoms, cough, headaches, nausea, diarrhea, poor p.o. ability.  Patient is here today because she feels very weak, too weak to go on, wants help managing the symptoms.  She also endorses occasional sharp chest pain, but not currently present.  Denies shortness of breath, no abdominal pain.  Review of Systems  A complete 10 system review of systems was obtained and all systems are negative except as noted in the HPI and PMH.   Patient's Health History    Past Medical History:  Diagnosis Date  . ADD (attention deficit disorder)   . Family history of colon cancer   . GERD (gastroesophageal reflux disease)   . H/O varicella   . History of adenomatous polyp of colon   . History of gastric ulcer   . History of shingles    2012--  residual intermittant rash  . IBS (irritable bowel syndrome)   . PONV (postoperative nausea and vomiting)   . SUI (stress urinary incontinence, female)     Past Surgical History:  Procedure Laterality Date  . CESAREAN SECTION N/A 02/10/2017   Procedure: CESAREAN SECTION;  Surgeon: Tyson Dense, MD;  Location: [place];  Service: Obstetrics;  Laterality: N/A;  . COLONOSCOPY W/ POLYPECTOMY  2007, 2011, 2017   rectal adenoma - 07  . LAPAROSCOPIC CHOLECYSTECTOMY  10-18-2005  . PUBOVAGINAL SLING N/A 10/10/2014   Procedure: Gaynelle Arabian;  Surgeon: Ailene Rud, MD;  Location: [facility];  Service: Urology;  Laterality: N/A;  . REPAIR  LACERATION OF FOREHEAD  age 53    Family History  Problem Relation Age of Onset  . Hypertension Mother   . Alcohol abuse Brother   . Diabetes Maternal Uncle   . Diabetes Paternal Aunt   . Seizures Paternal Uncle   . Diabetes Paternal Uncle   . Hypertension Maternal Grandmother   . Diabetes Maternal Grandmother   . Hypertension Maternal Grandfather   . Cancer Maternal Grandfather        prostate  . Heart disease Paternal Grandmother   . Diabetes Paternal Grandmother   . Cancer Cousin        pancreatic  . Diabetes Paternal Uncle     Social History   Socioeconomic History  . Marital status: Married    Spouse name: Not on file  . Number of children: Not on file  . Years of education: Not on file  . Highest education level: Not on file  Occupational History  . Not on file  Tobacco Use  . Smoking status: Never Smoker  . Smokeless tobacco: Never Used  Substance and Sexual Activity  . Alcohol use: Yes    Alcohol/week: 7.0 standard drinks    Types: 7 Glasses of wine per week    Comment: one wine daily  . Drug use: No  . Sexual activity: Yes    Birth control/protection: None  Other Topics Concern  . Not on file  Social History Narrative  . Not on file  Social Determinants of Health   Financial Resource Strain:   . Difficulty of Paying Living Expenses: Not on file  Food Insecurity:   . Worried About Charity fundraiser in the Last Year: Not on file  . Ran Out of Food in the Last Year: Not on file  Transportation Needs:   . Lack of Transportation (Medical): Not on file  . Lack of Transportation (Non-Medical): Not on file  Physical Activity:   . Days of Exercise per Week: Not on file  . Minutes of Exercise per Session: Not on file  Stress:   . Feeling of Stress : Not on file  Social Connections:   . Frequency of Communication with Friends and Family: Not on file  . Frequency of Social Gatherings with Friends and Family: Not on file  . Attends Religious Services:  Not on file  . Active Member of Clubs or Organizations: Not on file  . Attends Archivist Meetings: Not on file  . Marital Status: Not on file  Intimate Partner Violence:   . Fear of Current or Ex-Partner: Not on file  . Emotionally Abused: Not on file  . Physically Abused: Not on file  . Sexually Abused: Not on file     Physical Exam   Vitals:   06/23/20 1451 06/23/20 1622  BP: 98/66 104/70  Pulse: 66 68  Resp: 16 20  Temp: 98.5 F (36.9 C) 98.9 F (37.2 C)  SpO2: 100% 100%    CONSTITUTIONAL: Well-appearing, NAD NEURO:  Alert and oriented x 3, no focal deficits EYES:  eyes equal and reactive ENT/NECK:  no LAD, no JVD CARDIO: Regular rate, well-perfused, normal S1 and S2 PULM:  CTAB no wheezing or rhonchi GI/GU:  normal bowel sounds, non-distended, non-tender MSK/SPINE:  No gross deformities, no edema SKIN:  no rash, atraumatic PSYCH:  Appropriate speech and behavior  *Additional and/or pertinent findings included in MDM below  Diagnostic and Interventional Summary    EKG Interpretation  Date/Time:  Friday June 23 2020 15:09:09 EDT Ventricular Rate:  66 PR Interval:    QRS Duration: 89 QT Interval:  396 QTC Calculation: 415 R Axis:   53 Text Interpretation: Sinus rhythm Confirmed by Gerlene Fee [phone]) on 06/23/2020 4:12:32 PM      Labs Reviewed  COMPREHENSIVE METABOLIC PANEL - Abnormal; Notable for the following components:      Result Value   Calcium 8.4 (*)    Alkaline Phosphatase 37 (*)    All other components within normal limits  CBC    DG Chest Portable 1 View  Final Result      Medications  sodium chloride 0.9 % bolus 1,000 mL ( Intravenous Stopped 06/23/20 1620)     Procedures  /  Critical Care Procedures  ED Course and Medical Decision Making  I have reviewed the triage vital signs, the nursing notes, and pertinent available records from the EMR.  Listed above are laboratory and imaging tests that I personally ordered,  reviewed, and interpreted and then considered in my medical decision making (see below for details).  Consistent with continued symptoms of COVID-19.  Atypical sharp chest pain, will screen with EKG.  Patient has a heart rate of 65, no respiratory distress, no evidence of DVT, highly doubt pulmonary embolism.  Providing IV fluids, nausea medicine, will reassess.  Also check basic labs to evaluate for possible AKI.     Labs are reassuring, EKG is normal, patient continues to be in no acute distress with  normal vital signs, appropriate for discharge with reassurance and improved nausea regimen at home.  Barth Kirks. Sedonia Small, MD [place] [email or web address]  Final Clinical Impressions(s) / ED Diagnoses     ICD-10-CM   1. COVID-19  U07.1   2. Nausea  R11.0   3. Dehydration  E86.0     ED Discharge Orders         Ordered    ondansetron (ZOFRAN ODT) 4 MG disintegrating tablet  Every 8 hours PRN        06/23/20 1624           Discharge Instructions Discussed with and Provided to Patient:     Discharge Instructions     You were evaluated in the Emergency Department and after careful evaluation, we did not find any emergent condition requiring admission or further testing in the hospital.  Your exam/testing today is overall reassuring.  Your symptoms seem to be due to COVID-19.  You can use the Zofran oral dissolving tablets at home as needed for nausea.  We suspect you should be feeling better within the next few days.  Please return to the Emergency Department if you experience any worsening of your condition.   Thank you for allowing [place] to be a part of your care.       [name], MD 06/23/20 [phone]

## 2020-06-23 NOTE — ED Notes (Signed)
Pt on monitor 

## 2020-06-23 NOTE — ED Notes (Signed)
EDP in to speak with client, informed of lab results and plan of care, pt states she feels much better. Current plan is to DC to home

## 2020-06-23 NOTE — ED Triage Notes (Signed)
Pt c/o cough and SOB  x 2 weeks, receiving transfusions

## 2020-06-23 NOTE — ED Notes (Signed)
Tues was told Covid Positive, now having fever, cough, chest pain feeling, sweating at night, pains and aches in calfs as well, have some dyspnea on exertion. Taking a deep breath in chest pain occurs, feeling weak, fatigued. Poor sleeping, trying to take in PO fluids, has been having a lot of nausea, vomiting and diarrhea.

## 2020-06-23 NOTE — ED Notes (Signed)
AVS reviewed with client, also copy of AVS provided, discussed Rx written by EDP and informed of what pharmacy Rx is currently located. Also reviewed the CDC guidelines on quarantine for COVID 19. Opportunity for questions provided

## 2020-06-23 NOTE — Discharge Instructions (Addendum)
You were evaluated in the Emergency Department and after careful evaluation, we did not find any emergent condition requiring admission or further testing in the hospital.  Your exam/testing today is overall reassuring.  Your symptoms seem to be due to COVID-19.  You can use the Zofran oral dissolving tablets at home as needed for nausea.  We suspect you should be feeling better within the next few days.  Please return to the Emergency Department if you experience any worsening of your condition.   Thank you for allowing [place] to be a part of your care.

## 2020-08-29 DIAGNOSIS — N924 Excessive bleeding in the premenopausal period: Secondary | ICD-10-CM | POA: Diagnosis not present

## 2020-08-29 DIAGNOSIS — N939 Abnormal uterine and vaginal bleeding, unspecified: Secondary | ICD-10-CM | POA: Diagnosis not present

## 2020-11-07 ENCOUNTER — Other Ambulatory Visit: Payer: Self-pay

## 2020-11-07 ENCOUNTER — Ambulatory Visit
Admission: EM | Admit: 2020-11-07 | Discharge: 2020-11-07 | Disposition: A | Discharge status: Home / Self Care | Payer: BC Managed Care – PPO | Attending: Emergency Medicine | Admitting: Emergency Medicine

## 2020-11-07 ENCOUNTER — Encounter: Payer: Self-pay | Admitting: Emergency Medicine

## 2020-11-07 DIAGNOSIS — R509 Fever, unspecified: Secondary | ICD-10-CM | POA: Diagnosis not present

## 2020-11-07 DIAGNOSIS — Z1152 Encounter for screening for COVID-19: Secondary | ICD-10-CM | POA: Insufficient documentation

## 2020-11-07 DIAGNOSIS — R52 Pain, unspecified: Secondary | ICD-10-CM | POA: Diagnosis not present

## 2020-11-07 DIAGNOSIS — Z20822 Contact with and (suspected) exposure to covid-19: Secondary | ICD-10-CM | POA: Diagnosis not present

## 2020-11-07 DIAGNOSIS — M545 Low back pain, unspecified: Secondary | ICD-10-CM | POA: Diagnosis not present

## 2020-11-07 HISTORY — DX: COVID-19: U07.1

## 2020-11-07 LAB — POCT URINALYSIS DIP (MANUAL ENTRY)
Bilirubin, UA: NEGATIVE
Glucose, UA: NEGATIVE mg/dL
Ketones, POC UA: NEGATIVE mg/dL
Leukocytes, UA: NEGATIVE
Nitrite, UA: NEGATIVE
Protein Ur, POC: NEGATIVE mg/dL
Spec Grav, UA: 1.015 (ref 1.010–1.025)
Urobilinogen, UA: 0.2 E.U./dL
pH, UA: 5.5 (ref 5.0–8.0)

## 2020-11-07 MED ORDER — DEXAMETHASONE 4 MG PO TABS: 4.0000 mg | ORAL_TABLET | Freq: Every day | ORAL | 0 refills | Status: AC

## 2020-11-07 MED ORDER — BENZONATATE 100 MG PO CAPS: 100.0000 mg | ORAL_CAPSULE | Freq: Three times a day (TID) | ORAL | 0 refills | Status: DC | PRN

## 2020-11-07 MED ORDER — FLUTICASONE PROPIONATE 50 MCG/ACT NA SUSP: 1.0000 | Freq: Every day | NASAL | 0 refills | Status: DC

## 2020-11-07 NOTE — ED Triage Notes (Signed)
Pt took home Covid test today was negative. Complains of body aches, chills and sinus pressure. Took Advil and Tylenol at home pta.

## 2020-11-07 NOTE — Discharge Instructions (Signed)
Urinalysis was negative for UTI.  Sample will be sent for culture and someone will call if your result is abnormal.  COVID testing ordered.  It will take between 2-7 days for test results.  Someone will contact you regarding abnormal results.    Get plenty of rest and push fluids Tessalon Perles prescribed for cough Flonase for nasal congestion and runny nose Decadron was prescribed Use medications daily for symptom relief Use OTC medications like ibuprofen or tylenol as needed fever or pain Call or go to the ED if you have any new or worsening symptoms such as fever, worsening cough, shortness of breath, chest tightness, chest pain, turning blue, changes in mental status, etc..Marland Kitchen

## 2020-11-07 NOTE — ED Provider Notes (Signed)
[place]   [id number] 11/07/20 Arrival Time: [id number]   CC: COVID symptoms  SUBJECTIVE: History from: patient.  DEMITRI SMOLAR is a 35 y.o. female w who presented to the urgent care with a complaint of chills, fever, body aches and sinus pressure for the past few days.  She is also reporting lower back pain.  had positive COVID-19 test.  Denies sick exposure to COVID, flu or strep.  Denies recent travel.  Has tried OTC medication without relief.  Denies alleviating or aggravating factors.  Denies previous symptoms in the past.   Denies fever, chills, fatigue, sinus pain, rhinorrhea, sore throat, SOB, wheezing, chest pain, nausea, changes in bowel or bladder habits.     ROS: As per HPI.  All other pertinent ROS negative.      Past Medical History:  Diagnosis Date  . ADD (attention deficit disorder)   . COVID   . Family history of colon cancer   . GERD (gastroesophageal reflux disease)   . H/O varicella   . History of adenomatous polyp of colon   . History of gastric ulcer   . History of shingles    2012--  residual intermittant rash  . IBS (irritable bowel syndrome)   . PONV (postoperative nausea and vomiting)   . SUI (stress urinary incontinence, female)    Past Surgical History:  Procedure Laterality Date  . CESAREAN SECTION N/A 02/10/2017   Procedure: CESAREAN SECTION;  Surgeon: Tyson Dense, MD;  Location: [place];  Service: Obstetrics;  Laterality: N/A;  . COLONOSCOPY W/ POLYPECTOMY  2007, 2011, 2017   rectal adenoma - 07  . LAPAROSCOPIC CHOLECYSTECTOMY  10-18-2005  . PUBOVAGINAL SLING N/A 10/10/2014   Procedure: Gaynelle Arabian;  Surgeon: Ailene Rud, MD;  Location: [facility];  Service: Urology;  Laterality: N/A;  . REPAIR LACERATION OF FOREHEAD  age 32   Allergies  Allergen Reactions  . Codeine Nausea Only  . Hydrocodone-Acetaminophen Nausea Only   No current facility-administered medications on file prior to  encounter.   Current Outpatient Medications on File Prior to Encounter  Medication Sig Dispense Refill  . acetaminophen (TYLENOL) 500 MG tablet Take 1,000 mg by mouth every 6 (six) hours as needed for mild pain.    . Ascorbic Acid (VITAMIN C PO) Take 1 tablet by mouth daily.    Marland Kitchen aspirin EC 81 MG tablet Take 81 mg by mouth daily. Swallow whole.    . ibuprofen (ADVIL) 200 MG tablet Take 400 mg by mouth every 6 (six) hours as needed for fever.    . Multiple Vitamins-Minerals (ZINC PO) Take 1 tablet by mouth daily.    . budesonide (PULMICORT) 0.5 MG/2ML nebulizer solution Take 2 mLs by nebulization 2 (two) times daily.    . fenofibrate 160 MG tablet Take 160 mg by mouth daily.    . ondansetron (ZOFRAN ODT) 4 MG disintegrating tablet Take 1 tablet (4 mg total) by mouth every 8 (eight) hours as needed for nausea or vomiting. 20 tablet 0  . PROMETHEGAN 25 MG suppository Place 25 mg rectally 2 (two) times daily.    Marland Kitchen VITAMIN D PO Take 1 tablet by mouth daily.    . vitamin k 100 MCG tablet Take 100 mcg by mouth daily. With NAC     Social History   Socioeconomic History  . Marital status: Married    Spouse name: Not on file  . Number of children: Not on file  . Years of  education: Not on file  . Highest education level: Not on file  Occupational History  . Not on file  Tobacco Use  . Smoking status: Never Smoker  . Smokeless tobacco: Never Used  Substance and Sexual Activity  . Alcohol use: Yes    Alcohol/week: 7.0 standard drinks    Types: 7 Glasses of wine per week    Comment: one wine daily  . Drug use: No  . Sexual activity: Yes    Birth control/protection: None  Other Topics Concern  . Not on file  Social History Narrative  . Not on file   Social Determinants of Health   Financial Resource Strain: Not on file  Food Insecurity: Not on file  Transportation Needs: Not on file  Physical Activity: Not on file  Stress: Not on file  Social Connections: Not on file  Intimate  Partner Violence: Not on file   Family History  Problem Relation Age of Onset  . Hypertension Mother   . Alcohol abuse Brother   . Diabetes Maternal Uncle   . Diabetes Paternal Aunt   . Seizures Paternal Uncle   . Diabetes Paternal Uncle   . Hypertension Maternal Grandmother   . Diabetes Maternal Grandmother   . Hypertension Maternal Grandfather   . Cancer Maternal Grandfather        prostate  . Heart disease Paternal Grandmother   . Diabetes Paternal Grandmother   . Cancer Cousin        pancreatic  . Diabetes Paternal Uncle     OBJECTIVE:  Vitals:   11/07/20 1832  Weight: 138 lb (62.6 kg)  Height: 5\' 3"  (1.6 m)     General appearance: alert; appears fatigued, but nontoxic; speaking in full sentences and tolerating own secretions HEENT: NCAT; Ears: EACs clear, TMs pearly gray; Eyes: PERRL.  EOM grossly intact. Sinuses: nontender; Nose: nares patent without rhinorrhea, Throat: oropharynx clear, tonsils non erythematous or enlarged, uvula midline  Neck: supple without LAD Lungs: unlabored respirations, symmetrical air entry; cough: none; no respiratory distress; CTAB Heart: regular rate and rhythm.  Radial pulses 2+ symmetrical bilaterally Skin: warm and dry Psychological: alert and cooperative; normal mood and affect  LABS:  Results for orders placed or performed during the hospital encounter of 11/07/20 (from the past 24 hour(s))  POCT urinalysis dipstick     Status: Abnormal   Collection Time: 11/07/20  6:54 PM  Result Value Ref Range   Color, UA yellow yellow   Clarity, UA clear clear   Glucose, UA negative negative mg/dL   Bilirubin, UA negative negative   Ketones, POC UA negative negative mg/dL   Spec Grav, UA 1.015 1.010 - 1.025   Blood, UA trace-intact (A) negative   pH, UA 5.5 5.0 - 8.0   Protein Ur, POC negative negative mg/dL   Urobilinogen, UA 0.2 0.2 or 1.0 E.U./dL   Nitrite, UA Negative Negative   Leukocytes, UA Negative Negative     ASSESSMENT &  PLAN:  1. Chills with fever   2. Body aches   3. Encounter for screening for COVID-19   4. Acute bilateral low back pain without sciatica     Meds ordered this encounter  Medications  . benzonatate (TESSALON) 100 MG capsule    Sig: Take 1 capsule (100 mg total) by mouth 3 (three) times daily as needed for cough.    Dispense:  30 capsule    Refill:  0  . fluticasone (FLONASE) 50 MCG/ACT nasal spray    Sig:  Place 1 spray into both nostrils daily for 14 days.    Dispense:  16 g    Refill:  0  . dexamethasone (DECADRON) 4 MG tablet    Sig: Take 1 tablet (4 mg total) by mouth daily for 7 days.    Dispense:  7 tablet    Refill:  0    Discharge instructions  Urinalysis was negative for UTI.  Sample will be sent for culture and someone will call if your result is abnormal.  COVID testing ordered.  It will take between 2-7 days for test results.  Someone will contact you regarding abnormal results.    Get plenty of rest and push fluids Tessalon Perles prescribed for cough Flonase for nasal congestion and runny nose Decadron was prescribed Use medications daily for symptom relief Use OTC medications like ibuprofen or tylenol as needed fever or pain Call or go to the ED if you have any new or worsening symptoms such as fever, worsening cough, shortness of breath, chest tightness, chest pain, turning blue, changes in mental status, etc...   Reviewed expectations re: course of current medical issues. Questions answered. Outlined signs and symptoms indicating need for more acute intervention. Patient verbalized understanding. After Visit Summary given.         Emerson Monte, FNP 11/07/20 1903

## 2020-11-09 LAB — URINE CULTURE: Special Requests: NORMAL

## 2020-11-10 LAB — COVID-19, FLU A+B NAA
Influenza A, NAA: NOT DETECTED
Influenza B, NAA: NOT DETECTED
SARS-CoV-2, NAA: DETECTED — AB

## 2020-11-21 DIAGNOSIS — Z6825 Body mass index (BMI) 25.0-25.9, adult: Secondary | ICD-10-CM | POA: Diagnosis not present

## 2020-11-21 DIAGNOSIS — Z01419 Encounter for gynecological examination (general) (routine) without abnormal findings: Secondary | ICD-10-CM | POA: Diagnosis not present

## 2021-02-22 DIAGNOSIS — H16142 Punctate keratitis, left eye: Secondary | ICD-10-CM | POA: Diagnosis not present

## 2021-03-30 DIAGNOSIS — H16142 Punctate keratitis, left eye: Secondary | ICD-10-CM | POA: Diagnosis not present

## 2021-07-07 ENCOUNTER — Encounter (HOSPITAL_COMMUNITY): Payer: Self-pay | Admitting: Emergency Medicine

## 2021-07-07 ENCOUNTER — Other Ambulatory Visit: Payer: Self-pay

## 2021-07-07 ENCOUNTER — Inpatient Hospital Stay (HOSPITAL_COMMUNITY)
Admission: EM | Admit: 2021-07-07 | Discharge: 2021-07-08 | Disposition: A | Discharge status: Home / Self Care | Payer: BC Managed Care – PPO | Attending: Obstetrics and Gynecology | Admitting: Obstetrics and Gynecology

## 2021-07-07 DIAGNOSIS — Z3A01 Less than 8 weeks gestation of pregnancy: Secondary | ICD-10-CM

## 2021-07-07 DIAGNOSIS — O99611 Diseases of the digestive system complicating pregnancy, first trimester: Secondary | ICD-10-CM | POA: Diagnosis not present

## 2021-07-07 DIAGNOSIS — Z7982 Long term (current) use of aspirin: Secondary | ICD-10-CM | POA: Insufficient documentation

## 2021-07-07 DIAGNOSIS — O26891 Other specified pregnancy related conditions, first trimester: Secondary | ICD-10-CM | POA: Diagnosis not present

## 2021-07-07 DIAGNOSIS — Z7951 Long term (current) use of inhaled steroids: Secondary | ICD-10-CM | POA: Diagnosis not present

## 2021-07-07 DIAGNOSIS — Z8616 Personal history of COVID-19: Secondary | ICD-10-CM | POA: Insufficient documentation

## 2021-07-07 DIAGNOSIS — O021 Missed abortion: Secondary | ICD-10-CM

## 2021-07-07 DIAGNOSIS — Z885 Allergy status to narcotic agent status: Secondary | ICD-10-CM | POA: Diagnosis not present

## 2021-07-07 DIAGNOSIS — Z791 Long term (current) use of non-steroidal anti-inflammatories (NSAID): Secondary | ICD-10-CM | POA: Insufficient documentation

## 2021-07-07 DIAGNOSIS — O020 Blighted ovum and nonhydatidiform mole: Secondary | ICD-10-CM

## 2021-07-07 DIAGNOSIS — O4690 Antepartum hemorrhage, unspecified, unspecified trimester: Secondary | ICD-10-CM

## 2021-07-07 DIAGNOSIS — R109 Unspecified abdominal pain: Secondary | ICD-10-CM | POA: Insufficient documentation

## 2021-07-07 DIAGNOSIS — O09521 Supervision of elderly multigravida, first trimester: Secondary | ICD-10-CM | POA: Diagnosis not present

## 2021-07-07 DIAGNOSIS — O209 Hemorrhage in early pregnancy, unspecified: Secondary | ICD-10-CM | POA: Diagnosis not present

## 2021-07-07 LAB — POCT PREGNANCY, URINE: Preg Test, Ur: POSITIVE — AB

## 2021-07-07 NOTE — MAU Note (Signed)
Vaginal bleeding earlier, has slowed down.  Has 1st appt scheduled with Dr Helane Rima.  Fuller Song.

## 2021-07-07 NOTE — ED Provider Notes (Signed)
Emergency Medicine Provider Triage Evaluation Note  Kathryn Roberts , a 35 y.o. female  was evaluated in triage.  Pt complains of vaginal bleeding that began 2 hours ago.  She is a G4, P3 currently [redacted] weeks pregnant.  Also endorsing some lower abdominal cramping that began today.  Also endorses some headaches that have been ongoing for the last couple days..  Review of Systems  Positive: Abdominal pain, vaginal bleeding Negative: Urinary symptoms  Physical Exam  There were no vitals taken for this visit. Gen:   Awake, no distress   Resp:  Normal effort  MSK:   Moves extremities without difficulty  Other:    Medical Decision Making  Medically screening exam initiated at 9:41 PM.  Appropriate orders placed.  Kathryn Roberts was informed that the remainder of the evaluation will be completed by another provider, this initial triage assessment does not replace that evaluation, and the importance of remaining in the ED until their evaluation is complete.  Patient here with dental bleeding, currently [redacted] weeks pregnant.  MAU Janett Billow APP has been notified patient will be transferred via wheelchair   Kathryn Roberts, Kathryn Roberts 07/07/21 2144    Long, Kathryn Olds, MD 07/15/21 1758

## 2021-07-07 NOTE — ED Triage Notes (Signed)
Patient is [redacted] weeks pregnant G3P2 reports vaginal bleeding this evening with low abdominal cramping , seen by PA at arrival , report given to MAU RN for transfer .

## 2021-07-08 ENCOUNTER — Inpatient Hospital Stay (HOSPITAL_COMMUNITY): Payer: BC Managed Care – PPO

## 2021-07-08 DIAGNOSIS — O209 Hemorrhage in early pregnancy, unspecified: Secondary | ICD-10-CM | POA: Diagnosis not present

## 2021-07-08 DIAGNOSIS — O021 Missed abortion: Secondary | ICD-10-CM

## 2021-07-08 DIAGNOSIS — Z3A01 Less than 8 weeks gestation of pregnancy: Secondary | ICD-10-CM | POA: Diagnosis not present

## 2021-07-08 LAB — URINALYSIS, ROUTINE W REFLEX MICROSCOPIC
Bilirubin Urine: NEGATIVE
Glucose, UA: NEGATIVE mg/dL
Ketones, ur: NEGATIVE mg/dL
Nitrite: NEGATIVE
Protein, ur: NEGATIVE mg/dL
Specific Gravity, Urine: 1.01 (ref 1.005–1.030)
pH: 6 (ref 5.0–8.0)

## 2021-07-08 LAB — URINALYSIS, MICROSCOPIC (REFLEX)

## 2021-07-08 LAB — CBC
HCT: 34.9 % — ABNORMAL LOW (ref 36.0–46.0)
Hemoglobin: 12.1 g/dL (ref 12.0–15.0)
MCH: 32.4 pg (ref 26.0–34.0)
MCHC: 34.7 g/dL (ref 30.0–36.0)
MCV: 93.6 fL (ref 80.0–100.0)
Platelets: 211 10*3/uL (ref 150–400)
RBC: 3.73 MIL/uL — ABNORMAL LOW (ref 3.87–5.11)
RDW: 11.4 % — ABNORMAL LOW (ref 11.5–15.5)
WBC: 7.3 10*3/uL (ref 4.0–10.5)
nRBC: 0 % (ref 0.0–0.2)

## 2021-07-08 LAB — HCG, QUANTITATIVE, PREGNANCY: hCG, Beta Chain, Quant, S: 16095 m[IU]/mL — ABNORMAL HIGH (ref <5)

## 2021-07-08 MED ORDER — RHO D IMMUNE GLOBULIN 1500 UNIT/2ML IJ SOSY
300.0000 ug | PREFILLED_SYRINGE | Freq: Once | INTRAMUSCULAR | Status: AC
  Administered 2021-07-08: 300 ug via INTRAMUSCULAR
  Filled 2021-07-08: qty 2

## 2021-07-08 NOTE — MAU Provider Note (Signed)
History     CSN: [id number]  Arrival date and time: 07/07/21 2132   Event Date/Time   First Provider Initiated Contact with Patient 07/08/21 0017      Chief Complaint  Patient presents with   [redacted] Weeks Pregnant /Vaginal Bleeding and Abdominal Cramping    Abdominal Pain   Vaginal Bleeding   Kathryn Roberts is a 35 y.o. G3P2002 at 7.5 weeks by Definite LMP, but states her periods have been irregular since contracting Covid.  She receives care at [facility] and has an [place] scheduled for Sept 19.  She presents today for Vaginal Bleeding and Abdominal Cramping.  She states she started having bleeding at 1930 and it was bright red.  Patient reports it initially was "a lot" but has since "slowed down and is spotty."  She denies passing of clots. Patient states she has been having cramping throughout the pregnancy, but it worsened today.  Patient rates the pain a 4/10 and states it has no aggravating or known improving factors.  Patient reports she also has been experiencing increased HA and reports she has a mild frontal one that radiates to her temples.  She reports "it is like a sinus HA."  Patient denies issues with urination and constipation or diarrhea.  Patient endorses sexual activity in the past 3 days.    OB History     Gravida  3   Para  2   Term  2   Preterm      AB      Living  2      SAB      IAB      Ectopic      Multiple  0   Live Births  2           Past Medical History:  Diagnosis Date   ADD (attention deficit disorder)    COVID    Family history of colon cancer    GERD (gastroesophageal reflux disease)    H/O varicella    History of adenomatous polyp of colon    History of gastric ulcer    History of shingles    2012--  residual intermittant rash   IBS (irritable bowel syndrome)    PONV (postoperative nausea and vomiting)    SUI (stress urinary incontinence, female)     Past Surgical History:  Procedure Laterality Date   CESAREAN  SECTION N/A 02/10/2017   Procedure: CESAREAN SECTION;  Surgeon: Tyson Dense, MD;  Location: [facility];  Service: Obstetrics;  Laterality: N/A;   COLONOSCOPY W/ POLYPECTOMY  2007, 2011, 2017   rectal adenoma - 07   LAPAROSCOPIC CHOLECYSTECTOMY  10-18-2005   PUBOVAGINAL SLING N/A 10/10/2014   Procedure: Gaynelle Arabian;  Surgeon: Ailene Rud, MD;  Location: [facility];  Service: Urology;  Laterality: N/A;   REPAIR LACERATION OF FOREHEAD  age 72    Family History  Problem Relation Age of Onset   Hypertension Mother    Alcohol abuse Brother    Diabetes Maternal Uncle    Diabetes Paternal Aunt    Seizures Paternal Uncle    Diabetes Paternal Uncle    Hypertension Maternal Grandmother    Diabetes Maternal Grandmother    Hypertension Maternal Grandfather    Cancer Maternal Grandfather        prostate   Heart disease Paternal Grandmother    Diabetes Paternal Grandmother    Cancer Cousin        pancreatic  Diabetes Paternal Uncle     Social History   Tobacco Use   Smoking status: Never   Smokeless tobacco: Never  Vaping Use   Vaping Use: Never used  Substance Use Topics   Alcohol use: Not Currently    Alcohol/week: 7.0 standard drinks    Types: 7 Glasses of wine per week    Comment: one wine daily   Drug use: No    Allergies:  Allergies  Allergen Reactions   Codeine Nausea Only   Hydrocodone-Acetaminophen Nausea Only    Medications Prior to Admission  Medication Sig Dispense Refill Last Dose   calcium carbonate (TUMS - DOSED IN MG ELEMENTAL CALCIUM) 500 MG chewable tablet Chew 1 tablet by mouth daily.   07/07/2021   prenatal vitamin w/FE, FA (PRENATAL 1 + 1) 27-1 MG TABS tablet Take 1 tablet by mouth daily at 12 noon.   07/06/2021   acetaminophen (TYLENOL) 500 MG tablet Take 1,000 mg by mouth every 6 (six) hours as needed for mild pain.      Ascorbic Acid (VITAMIN C PO) Take 1 tablet by mouth daily.      aspirin EC 81 MG tablet  Take 81 mg by mouth daily. Swallow whole.      benzonatate (TESSALON) 100 MG capsule Take 1 capsule (100 mg total) by mouth 3 (three) times daily as needed for cough. 30 capsule 0    budesonide (PULMICORT) 0.5 MG/2ML nebulizer solution Take 2 mLs by nebulization 2 (two) times daily.      fenofibrate 160 MG tablet Take 160 mg by mouth daily.      fluticasone (FLONASE) 50 MCG/ACT nasal spray Place 1 spray into both nostrils daily for 14 days. 16 g 0    ibuprofen (ADVIL) 200 MG tablet Take 400 mg by mouth every 6 (six) hours as needed for fever.      Multiple Vitamins-Minerals (ZINC PO) Take 1 tablet by mouth daily.      ondansetron (ZOFRAN ODT) 4 MG disintegrating tablet Take 1 tablet (4 mg total) by mouth every 8 (eight) hours as needed for nausea or vomiting. 20 tablet 0    PROMETHEGAN 25 MG suppository Place 25 mg rectally 2 (two) times daily.      VITAMIN D PO Take 1 tablet by mouth daily.      vitamin k 100 MCG tablet Take 100 mcg by mouth daily. With NAC       Review of Systems  Gastrointestinal:  Positive for abdominal pain (Cramping). Negative for constipation, diarrhea, nausea and vomiting.  Genitourinary:  Positive for vaginal bleeding. Negative for difficulty urinating, dysuria and vaginal discharge.  Neurological:  Positive for headaches. Negative for dizziness and light-headedness.  Physical Exam   Blood pressure 99/60, pulse 71, temperature 98.2 F (36.8 C), temperature source Oral, resp. rate 16, height '5\' 4"'$  (1.626 m), weight 68.9 kg, last menstrual period 05/09/2021, SpO2 99 %, unknown if currently breastfeeding.  Physical Exam Vitals reviewed. Exam conducted with a chaperone present.  Constitutional:      Appearance: Normal appearance. She is well-developed.  HENT:     Head: Normocephalic and atraumatic.  Eyes:     Conjunctiva/sclera: Conjunctivae normal.  Cardiovascular:     Rate and Rhythm: Normal rate.  Pulmonary:     Effort: Pulmonary effort is normal. No  respiratory distress.  Abdominal:     Palpations: Abdomen is soft.     Tenderness: There is abdominal tenderness in the right lower quadrant. There is no guarding or  rebound.  Genitourinary:    Comments: Speculum Exam: -Normal External Genitalia: Non tender, no apparent discharge or blood at introitus.  -Vaginal Vault: Pink mucosa with good rugae. Scant amt dark red mucous/blood -Cervix:Pink, no lesions, or polyps. Nabothian cysts noted at 1 and 5 'o clock. Cervix appears closed. No active bleeding from os- -Bimanual Exam:  Uterine size difficult to assess d/t position and patient discomfort.  Tenderness in right cul de sac and adnexa area.  Musculoskeletal:     Cervical back: Normal range of motion.  Skin:    General: Skin is warm and dry.  Neurological:     Mental Status: She is alert and oriented to person, place, and time.  Psychiatric:        Mood and Affect: Mood normal.        Behavior: Behavior normal.    MAU Course  Procedures Results for orders placed or performed during the hospital encounter of 07/07/21 (from the past 24 hour(s))  Urinalysis, Routine w reflex microscopic Urine, Clean Catch     Status: Abnormal   Collection Time: 07/07/21 11:36 PM  Result Value Ref Range   Color, Urine YELLOW YELLOW   APPearance CLEAR CLEAR   Specific Gravity, Urine 1.010 1.005 - 1.030   pH 6.0 5.0 - 8.0   Glucose, UA NEGATIVE NEGATIVE mg/dL   Hgb urine dipstick LARGE (A) NEGATIVE   Bilirubin Urine NEGATIVE NEGATIVE   Ketones, ur NEGATIVE NEGATIVE mg/dL   Protein, ur NEGATIVE NEGATIVE mg/dL   Nitrite NEGATIVE NEGATIVE   Leukocytes,Ua MODERATE (A) NEGATIVE  Pregnancy, urine POC     Status: Abnormal   Collection Time: 07/07/21 11:36 PM  Result Value Ref Range   Preg Test, Ur POSITIVE (A) NEGATIVE  Urinalysis, Microscopic (reflex)     Status: Abnormal   Collection Time: 07/07/21 11:36 PM  Result Value Ref Range   RBC / HPF 0-5 0 - 5 RBC/hpf   WBC, UA 21-50 0 - 5 WBC/hpf    Bacteria, UA RARE (A) NONE SEEN   Squamous Epithelial / LPF 0-5 0 - 5   Mucus PRESENT   CBC     Status: Abnormal   Collection Time: 07/07/21 11:52 PM  Result Value Ref Range   WBC 7.3 4.0 - 10.5 K/uL   RBC 3.73 (L) 3.87 - 5.11 MIL/uL   Hemoglobin 12.1 12.0 - 15.0 g/dL   HCT 34.9 (L) 36.0 - 46.0 %   MCV 93.6 80.0 - 100.0 fL   MCH 32.4 26.0 - 34.0 pg   MCHC 34.7 30.0 - 36.0 g/dL   RDW 11.4 (L) 11.5 - 15.5 %   Platelets 211 150 - 400 K/uL   nRBC 0.0 0.0 - 0.2 %  hCG, quantitative, pregnancy     Status: Abnormal   Collection Time: 07/07/21 11:52 PM  Result Value Ref Range   hCG, Beta Chain, Quant, S 16,095 (H) <5 mIU/mL  Rh IG workup (includes ABO/Rh)     Status: None (Preliminary result)   Collection Time: 07/07/21 11:52 PM  Result Value Ref Range   Gestational Age(Wks) 6    ABO/RH(D) A NEG    Antibody Screen NEG    Unit Number [id number]    Blood Component Type RHIG    Unit division 00    Status of Unit ISSUED    Transfusion Status      OK TO TRANSFUSE Performed at [facility], [address], [place], [place] [address]    US OB LESS  THAN 14 WEEKS WITH OB TRANSVAGINAL  Result Date: 07/08/2021 CLINICAL DATA:  35 year old pregnant female with cramping and bleeding. LMP: 05/15/2021 corresponding to an estimated gestational age of [redacted] weeks, 5 days. EXAM: OBSTETRIC <14 WK [place] AND TRANSVAGINAL OB US TECHNIQUE: Both transabdominal and transvaginal ultrasound examinations were performed for complete evaluation of the gestation as well as the maternal uterus, adnexal regions, and pelvic cul-de-sac. Transvaginal technique was performed to assess early pregnancy. COMPARISON:  None. FINDINGS: There is an intrauterine gestational sac containing a yolk sac and fetal pole. The crown-rump length measures 7 mm corresponding to 6 weeks, 4 days. No embryonic cardiac activity identified. Findings diagnostic of failed early pregnancy. A smaller cystic structure noted adjacent to the gestational  sac measuring 8 x 6 x 8 mm. This may represent a second gestational sac, a blighted ovum, or an endometrial cyst. No yolk sac or fetal pole identified within this smaller cystic structure. If this structure is a true gestational sac the estimated gestational age is approximately 5 weeks, 3 days. The maternal ovaries are unremarkable. There is a corpus luteum in the left ovary. No significant free fluid within the pelvis. IMPRESSION: 1. Failed early intrauterine pregnancy with an estimated gestational age of [redacted] weeks, 4 days. 2. Additional cystic structure adjacent to the gestational sac may represent a second gestational sac, or a blighted ovum. Clinical correlation and obstetrical consult is advised. Electronically Signed   By: Anner Crete M.D.   On: 07/08/2021 02:28    MDM Pelvic Exam; Wet Prep and GC/CT Labs: UA, UPT, CBC, hCG, ABO Ultrasound Rhogam Assessment and Plan  35 year old G3P2002 at 7.5 weeks Vaginal Bleeding A Negative  -Reviewed POC with patient. -Exam performed and findings discussed.  -Patient offered and declines pain medication -Informed that due to reports of bleeding will give rhogam injection. -Will send for [place] and await results.   Maryann Conners 07/08/2021, 12:17 AM   Reassessment (2:40 AM) Failed Pregnancy  -[place] results return as above and reviewed by provider.  -Provider to bedside to discuss. -Informed of abnormal findings of anembryonic pregnancy vs 2nd gestational sac.  -Discussed need for follow up with repeat US. -Patient reports she is scheduled for Sept 19th. -Further informed of embryo with no HR and measurement c/w failed pregnancy. -Bleeding precautions discussed. -Patient and husband appropriately confused with findings and provider addresses questions and concerns as best she can. -Encouraged to monitor and discouraged making major life modifications based on findings (I.e. not going to work or partaking in bed rest). -Reviewed expectant  management and informed that usual practice is to allow for 2-4 weeks from time of diagnosis.  -Patient and SO with no further questions. --Encouraged to call primary office or return to MAU if symptoms worsen or with the onset of new symptoms. -Discharged to home in stable condition.  Maryann Conners MSN, CNM Advanced Practice Provider, [facility]

## 2021-07-09 LAB — RH IG WORKUP (INCLUDES ABO/RH)
ABO/RH(D): A NEG
Antibody Screen: NEGATIVE
Gestational Age(Wks): 6
Unit division: 0

## 2021-07-10 ENCOUNTER — Encounter: Payer: Self-pay | Admitting: Internal Medicine

## 2021-07-12 DIAGNOSIS — N911 Secondary amenorrhea: Secondary | ICD-10-CM | POA: Diagnosis not present

## 2021-07-12 DIAGNOSIS — O021 Missed abortion: Secondary | ICD-10-CM | POA: Diagnosis not present

## 2021-07-13 NOTE — H&P (Signed)
35 year old female with missed abortion.  RH negative - received Rhogam last week  Past Medical History:  Diagnosis Date   ADD (attention deficit disorder)    COVID    Family history of colon cancer    GERD (gastroesophageal reflux disease)    H/O varicella    History of adenomatous polyp of colon    History of gastric ulcer    History of shingles    2012--  residual intermittant rash   IBS (irritable bowel syndrome)    PONV (postoperative nausea and vomiting)    SUI (stress urinary incontinence, female)    Past Surgical History:  Procedure Laterality Date   CESAREAN SECTION N/A 02/10/2017   Procedure: CESAREAN SECTION;  Surgeon: Tyson Dense, MD;  Location: [facility];  Service: Obstetrics;  Laterality: N/A;   COLONOSCOPY W/ POLYPECTOMY  2007, 2011, 2017   rectal adenoma - 07   LAPAROSCOPIC CHOLECYSTECTOMY  10-18-2005   PUBOVAGINAL SLING N/A 10/10/2014   Procedure: Gaynelle Arabian;  Surgeon: Ailene Rud, MD;  Location: [facility];  Service: Urology;  Laterality: N/A;   REPAIR LACERATION OF FOREHEAD  age 38   Prior to Admission medications   Medication Sig Start Date End Date Taking? Authorizing Provider  acetaminophen (TYLENOL) 500 MG tablet Take 1,000 mg by mouth every 6 (six) hours as needed for mild pain.    [provider]  Ascorbic Acid (VITAMIN C PO) Take 1 tablet by mouth daily.    [provider]  aspirin EC 81 MG tablet Take 81 mg by mouth daily. Swallow whole.    [provider]  benzonatate (TESSALON) 100 MG capsule Take 1 capsule (100 mg total) by mouth 3 (three) times daily as needed for cough. 11/07/20   Avegno, [name], FNP  budesonide (PULMICORT) 0.5 MG/2ML nebulizer solution Take 2 mLs by nebulization 2 (two) times daily. 06/19/20   [provider]  calcium carbonate (TUMS - DOSED IN MG ELEMENTAL CALCIUM) 500 MG chewable tablet Chew 1 tablet by mouth daily.    [provider]  fenofibrate 160 MG tablet Take 160 mg by mouth daily. 06/20/20   [provider]  fluticasone (FLONASE) 50 MCG/ACT nasal spray Place 1 spray into both nostrils daily for 14 days. 11/07/20 11/21/20  Avegno, [name], FNP  Multiple Vitamins-Minerals (ZINC PO) Take 1 tablet by mouth daily.    [provider]  ondansetron (ZOFRAN ODT) 4 MG disintegrating tablet Take 1 tablet (4 mg total) by mouth every 8 (eight) hours as needed for nausea or vomiting. 06/23/20   [name], MD  prenatal vitamin w/FE, FA (PRENATAL 1 + 1) 27-1 MG TABS tablet Take 1 tablet by mouth daily at 12 noon.    [provider]  PROMETHEGAN 25 MG suppository Place 25 mg rectally 2 (two) times daily. 06/21/20   [provider]  VITAMIN D PO Take 1 tablet by mouth daily.    [provider]  vitamin k 100 MCG tablet Take 100 mcg by mouth daily. With NAC    [provider]   Scheduled Meds: Continuous Infusions: PRN Meds:. Family History  Problem Relation Age of Onset   Hypertension Mother    Alcohol abuse Brother    Diabetes Maternal Uncle    Diabetes Paternal Aunt    Seizures Paternal Uncle    Diabetes Paternal Uncle    Hypertension Maternal Grandmother    Diabetes Maternal Grandmother    Hypertension  Maternal Grandfather    Cancer Maternal Grandfather        prostate   Heart disease Paternal Grandmother    Diabetes Paternal Grandmother    Cancer Cousin        pancreatic   Diabetes Paternal Uncle    Social History   Socioeconomic History   Marital status: Married    Spouse name: Not on file   Number of children: Not on file   Years of education: Not on file   Highest education level: Not on file  Occupational History   Not on file  Tobacco Use   Smoking status: Never   Smokeless tobacco: Never  Vaping Use   Vaping Use: Never used  Substance and Sexual Activity   Alcohol use: Not Currently    Alcohol/week: 7.0 standard drinks    Types: 7  Glasses of wine per week    Comment: one wine daily   Drug use: No   Sexual activity: Yes    Birth control/protection: None  Other Topics Concern   Not on file  Social History Narrative   Not on file   Social Determinants of Health   Financial Resource Strain: Not on file  Food Insecurity: Not on file  Transportation Needs: Not on file  Physical Activity: Not on file  Stress: Not on file  Social Connections: Not on file    General alert and oriented Lung CTAB Car RRR Abdomen is soft and non tender Pelvic above  IMPRESSION: Missed Abortion  Plan: D and E  Genetics Ultrasound guidance Rh negative

## 2021-07-13 NOTE — H&P (Deleted)
  The note originally documented on this encounter has been moved the the encounter in which it belongs.  

## 2021-07-14 ENCOUNTER — Encounter (HOSPITAL_COMMUNITY): Payer: Self-pay | Admitting: Obstetrics and Gynecology

## 2021-07-16 DIAGNOSIS — O021 Missed abortion: Secondary | ICD-10-CM | POA: Diagnosis not present

## 2021-07-16 DIAGNOSIS — N939 Abnormal uterine and vaginal bleeding, unspecified: Secondary | ICD-10-CM | POA: Diagnosis not present

## 2021-07-16 DIAGNOSIS — O034 Incomplete spontaneous abortion without complication: Secondary | ICD-10-CM | POA: Diagnosis not present

## 2021-07-19 ENCOUNTER — Ambulatory Visit (HOSPITAL_COMMUNITY)
Admission: RE | Admit: 2021-07-19 | Payer: BC Managed Care – PPO | Source: Home / Self Care | Admitting: Obstetrics and Gynecology

## 2021-07-19 ENCOUNTER — Encounter (HOSPITAL_COMMUNITY): Admission: RE | Payer: Self-pay | Source: Home / Self Care

## 2021-07-19 SURGERY — DILATION AND EVACUATION, UTERUS
Anesthesia: Choice

## 2021-07-23 ENCOUNTER — Other Ambulatory Visit: Payer: Self-pay

## 2021-07-23 ENCOUNTER — Encounter (HOSPITAL_COMMUNITY): Payer: Self-pay | Admitting: Obstetrics and Gynecology

## 2021-07-23 DIAGNOSIS — O021 Missed abortion: Secondary | ICD-10-CM | POA: Diagnosis not present

## 2021-07-23 NOTE — Progress Notes (Signed)
Patient seen in office today  Tried cytotec  Ultrasound sac still retained in cervix  Will proceed with D and E

## 2021-07-23 NOTE — Progress Notes (Signed)
Spoke with pt for pre-op call. Pt denies cardiac history, HTN or Diabetes.   Pt's surgery is scheduled as ambulatory so no Covid test is required prior to surgery.

## 2021-07-24 ENCOUNTER — Encounter (HOSPITAL_COMMUNITY)
Admission: RE | Disposition: A | Discharge status: Home / Self Care | Payer: Self-pay | Source: Ambulatory Visit | Attending: Obstetrics and Gynecology

## 2021-07-24 ENCOUNTER — Ambulatory Visit (HOSPITAL_COMMUNITY): Payer: BC Managed Care – PPO

## 2021-07-24 ENCOUNTER — Ambulatory Visit (HOSPITAL_COMMUNITY)
Admission: RE | Admit: 2021-07-24 | Discharge: 2021-07-24 | Disposition: A | Discharge status: Home / Self Care | Payer: BC Managed Care – PPO | Source: Ambulatory Visit | Attending: Obstetrics and Gynecology | Admitting: Obstetrics and Gynecology

## 2021-07-24 ENCOUNTER — Other Ambulatory Visit: Payer: Self-pay

## 2021-07-24 ENCOUNTER — Encounter (HOSPITAL_COMMUNITY): Payer: Self-pay | Admitting: Obstetrics and Gynecology

## 2021-07-24 DIAGNOSIS — Z7951 Long term (current) use of inhaled steroids: Secondary | ICD-10-CM | POA: Diagnosis not present

## 2021-07-24 DIAGNOSIS — O034 Incomplete spontaneous abortion without complication: Secondary | ICD-10-CM | POA: Insufficient documentation

## 2021-07-24 DIAGNOSIS — Z7982 Long term (current) use of aspirin: Secondary | ICD-10-CM | POA: Diagnosis not present

## 2021-07-24 DIAGNOSIS — Z8616 Personal history of COVID-19: Secondary | ICD-10-CM | POA: Diagnosis not present

## 2021-07-24 DIAGNOSIS — O021 Missed abortion: Secondary | ICD-10-CM | POA: Diagnosis not present

## 2021-07-24 DIAGNOSIS — D649 Anemia, unspecified: Secondary | ICD-10-CM | POA: Diagnosis not present

## 2021-07-24 DIAGNOSIS — Z79899 Other long term (current) drug therapy: Secondary | ICD-10-CM | POA: Diagnosis not present

## 2021-07-24 DIAGNOSIS — O9903 Anemia complicating the puerperium: Secondary | ICD-10-CM | POA: Diagnosis not present

## 2021-07-24 HISTORY — DX: Personal history of urinary calculi: Z87.442

## 2021-07-24 HISTORY — PX: DILATION AND EVACUATION: SHX1459

## 2021-07-24 HISTORY — DX: Anxiety disorder, unspecified: F41.9

## 2021-07-24 HISTORY — DX: Depression, unspecified: F32.A

## 2021-07-24 HISTORY — DX: Anemia, unspecified: D64.9

## 2021-07-24 LAB — CBC
HCT: 34.4 % — ABNORMAL LOW (ref 36.0–46.0)
Hemoglobin: 11.8 g/dL — ABNORMAL LOW (ref 12.0–15.0)
MCH: 32.3 pg (ref 26.0–34.0)
MCHC: 34.3 g/dL (ref 30.0–36.0)
MCV: 94.2 fL (ref 80.0–100.0)
Platelets: 232 10*3/uL (ref 150–400)
RBC: 3.65 MIL/uL — ABNORMAL LOW (ref 3.87–5.11)
RDW: 11.7 % (ref 11.5–15.5)
WBC: 6 10*3/uL (ref 4.0–10.5)
nRBC: 0 % (ref 0.0–0.2)

## 2021-07-24 LAB — TYPE AND SCREEN
ABO/RH(D): A NEG
Antibody Screen: POSITIVE

## 2021-07-24 SURGERY — DILATION AND EVACUATION, UTERUS
Anesthesia: Monitor Anesthesia Care

## 2021-07-24 MED ORDER — FENTANYL CITRATE (PF) 250 MCG/5ML IJ SOLN
INTRAMUSCULAR | Status: DC | PRN
  Administered 2021-07-24: 25 ug via INTRAVENOUS
  Administered 2021-07-24: 50 ug via INTRAVENOUS

## 2021-07-24 MED ORDER — ONDANSETRON HCL 4 MG/2ML IJ SOLN: 4.0000 mg | Freq: Once | INTRAMUSCULAR | Status: DC | PRN

## 2021-07-24 MED ORDER — PROPOFOL 500 MG/50ML IV EMUL
INTRAVENOUS | Status: DC | PRN
  Administered 2021-07-24: 90 ug/kg/min via INTRAVENOUS
  Administered 2021-07-24: 20 mg via INTRAVENOUS

## 2021-07-24 MED ORDER — ONDANSETRON HCL 4 MG/2ML IJ SOLN
INTRAMUSCULAR | Status: DC | PRN
  Administered 2021-07-24: 4 mg via INTRAVENOUS

## 2021-07-24 MED ORDER — FENTANYL CITRATE (PF) 100 MCG/2ML IJ SOLN
25.0000 ug | INTRAMUSCULAR | Status: DC | PRN
  Administered 2021-07-24 (×2): 25 ug via INTRAVENOUS

## 2021-07-24 MED ORDER — ORAL CARE MOUTH RINSE: 15.0000 mL | Freq: Once | OROMUCOSAL | Status: AC

## 2021-07-24 MED ORDER — PROPOFOL 10 MG/ML IV BOLUS
INTRAVENOUS | Status: AC
  Filled 2021-07-24: qty 20

## 2021-07-24 MED ORDER — ONDANSETRON HCL 4 MG/2ML IJ SOLN
INTRAMUSCULAR | Status: AC
  Filled 2021-07-24: qty 2

## 2021-07-24 MED ORDER — POVIDONE-IODINE 10 % EX SWAB: 2.0000 "application " | Freq: Once | CUTANEOUS | Status: DC

## 2021-07-24 MED ORDER — SCOPOLAMINE 1 MG/3DAYS TD PT72
1.0000 | MEDICATED_PATCH | TRANSDERMAL | Status: DC
  Administered 2021-07-24: 1.5 mg via TRANSDERMAL
  Filled 2021-07-24: qty 1

## 2021-07-24 MED ORDER — LIDOCAINE HCL 1 % IJ SOLN
INTRAMUSCULAR | Status: DC | PRN
  Administered 2021-07-24: 10 mL

## 2021-07-24 MED ORDER — LIDOCAINE HCL 1 % IJ SOLN
INTRAMUSCULAR | Status: AC
  Filled 2021-07-24: qty 20

## 2021-07-24 MED ORDER — OXYCODONE HCL 5 MG PO TABS: 5.0000 mg | ORAL_TABLET | Freq: Once | ORAL | Status: DC | PRN

## 2021-07-24 MED ORDER — CHLORHEXIDINE GLUCONATE 0.12 % MT SOLN
15.0000 mL | Freq: Once | OROMUCOSAL | Status: AC
  Administered 2021-07-24: 15 mL via OROMUCOSAL
  Filled 2021-07-24: qty 15

## 2021-07-24 MED ORDER — MIDAZOLAM HCL 2 MG/2ML IJ SOLN
INTRAMUSCULAR | Status: AC
  Filled 2021-07-24: qty 2

## 2021-07-24 MED ORDER — FENTANYL CITRATE (PF) 100 MCG/2ML IJ SOLN
INTRAMUSCULAR | Status: AC
  Filled 2021-07-24: qty 2

## 2021-07-24 MED ORDER — MIDAZOLAM HCL 2 MG/2ML IJ SOLN
INTRAMUSCULAR | Status: DC | PRN
  Administered 2021-07-24: 2 mg via INTRAVENOUS

## 2021-07-24 MED ORDER — SODIUM CHLORIDE 0.9 % IV SOLN
2.0000 g | INTRAVENOUS | Status: AC
  Administered 2021-07-24: 2 g via INTRAVENOUS
  Filled 2021-07-24: qty 2

## 2021-07-24 MED ORDER — OXYCODONE HCL 5 MG/5ML PO SOLN: 5.0000 mg | Freq: Once | ORAL | Status: DC | PRN

## 2021-07-24 MED ORDER — FENTANYL CITRATE (PF) 250 MCG/5ML IJ SOLN
INTRAMUSCULAR | Status: AC
  Filled 2021-07-24: qty 5

## 2021-07-24 MED ORDER — LACTATED RINGERS IV SOLN: INTRAVENOUS | Status: DC

## 2021-07-24 SURGICAL SUPPLY — 21 items
CANNULA CURETTE W/SYR 7 (CANNULA) ×1 IMPLANT
CATH ROBINSON RED A/P 16FR (CATHETERS) ×2 IMPLANT
DECANTER SPIKE VIAL GLASS SM (MISCELLANEOUS) ×2 IMPLANT
FILTER UTR ASPR ASSEMBLY (MISCELLANEOUS) ×2 IMPLANT
GLOVE SURG ENC MOIS LTX SZ6.5 (GLOVE) ×2 IMPLANT
GLOVE SURG UNDER POLY LF SZ7 (GLOVE) ×2 IMPLANT
GOWN STRL REUS W/ TWL LRG LVL3 (GOWN DISPOSABLE) ×2 IMPLANT
GOWN STRL REUS W/TWL LRG LVL3 (GOWN DISPOSABLE) ×4
HOSE CONNECTING 18IN BERKELEY (TUBING) ×2 IMPLANT
KIT BERKELEY 1ST TRI 3/8 NO TR (MISCELLANEOUS) ×2 IMPLANT
KIT BERKELEY 1ST TRIMESTER 3/8 (MISCELLANEOUS) ×2 IMPLANT
NS IRRIG 1000ML POUR BTL (IV SOLUTION) ×2 IMPLANT
PACK VAGINAL MINOR WOMEN LF (CUSTOM PROCEDURE TRAY) ×2 IMPLANT
PAD OB MATERNITY 4.3X12.25 (PERSONAL CARE ITEMS) ×2 IMPLANT
SET BERKELEY SUCTION TUBING (SUCTIONS) ×2 IMPLANT
TOWEL GREEN STERILE FF (TOWEL DISPOSABLE) ×4 IMPLANT
UNDERPAD 30X36 HEAVY ABSORB (UNDERPADS AND DIAPERS) ×2 IMPLANT
VACURETTE 10 RIGID CVD (CANNULA) IMPLANT
VACURETTE 7MM CVD STRL WRAP (CANNULA) IMPLANT
VACURETTE 8 RIGID CVD (CANNULA) IMPLANT
VACURETTE 9 RIGID CVD (CANNULA) IMPLANT

## 2021-07-24 NOTE — Op Note (Signed)
NAME: Kathryn Roberts, Kathryn Roberts MEDICAL RECORD NO: [id number] ACCOUNT NO: [id number] DATE OF BIRTH: 1986/03/10 FACILITY: [facility] LOCATION: [facility] PHYSICIAN: Deandra Gadson L. Helane Rima, MD  Operative Report   DATE OF PROCEDURE: 07/24/2021  PREOPERATIVE DIAGNOSIS:  Retained products of conception.  POSTOPERATIVE DIAGNOSIS:  Retained products of conception.  PROCEDURE:  Dilatation and evacuation.  SURGEON:  Stryker Veasey L. Flynn Lininger, MD  ANESTHESIA:  IV sedation with paracervical block.  PATHOLOGY:  Products of conception.  ESTIMATED BLOOD LOSS:  Less than 50 mL  COMPLICATIONS:  None.  DESCRIPTION OF PROCEDURE:  The patient was taken to the operating room.  She was administered anesthesia.  She was placed in the lithotomy position.  She was prepped and draped in the usual sterile fashion.  Timeout was performed.  A speculum was  inserted.  The cervix was grasped with a tenaculum.  A paracervical block was performed.  Cervical os was gently dilated using Pratt dilators.  The sharp curette was inserted and the uterus was thoroughly curetted of tissue, which was very minimal.  A #7  suction cannula was inserted and a suction curettage was performed with retrieval of scant retained products of conception.  A final sharp curette was inserted and the uterus was thoroughly clean. At the end of the procedure, all instruments were  removed from the vagina and the patient went to recovery room in stable condition.  All sponge, lap and instrument counts were correct x2.   PUS D: 07/24/2021 1:36:25 pm T: 07/24/2021 2:00:00 pm  JOB: [id number]/ [id number]

## 2021-07-24 NOTE — Progress Notes (Signed)
H and P on the chart No changes  Will proceed with D and E  Consent signed

## 2021-07-24 NOTE — Anesthesia Preprocedure Evaluation (Signed)
Anesthesia Evaluation  Patient identified by MRN, date of birth, ID band Patient awake    Reviewed: Allergy & Precautions, NPO status , Patient's Chart, lab work & pertinent test results  History of Anesthesia Complications (+) PONV and history of anesthetic complications  Airway Mallampati: II  TM Distance: >3 FB Neck ROM: Full    Dental no notable dental hx. (+) Teeth Intact, Dental Advisory Given, Caps   Pulmonary neg pulmonary ROS,    Pulmonary exam normal breath sounds clear to auscultation       Cardiovascular negative cardio ROS Normal cardiovascular exam Rhythm:Regular Rate:Normal     Neuro/Psych PSYCHIATRIC DISORDERS Anxiety Depression ADDnegative neurological ROS     GI/Hepatic Neg liver ROS, GERD  Medicated and Controlled,  Endo/Other  negative endocrine ROS  Renal/GU negative Renal ROS Bladder dysfunction      Musculoskeletal negative musculoskeletal ROS (+)   Abdominal   Peds  Hematology  (+) anemia ,   Anesthesia Other Findings   Reproductive/Obstetrics Retained POC                             Anesthesia Physical Anesthesia Plan  ASA: 2  Anesthesia Plan: MAC   Post-op Pain Management:    Induction: Intravenous  PONV Risk Score and Plan: 4 or greater and Treatment may vary due to age or medical condition, Midazolam, Ondansetron and Propofol infusion  Airway Management Planned: Natural Airway and Simple Face Mask  Additional Equipment:   Intra-op Plan:   Post-operative Plan:   Informed Consent: I have reviewed the patients History and Physical, chart, labs and discussed the procedure including the risks, benefits and alternatives for the proposed anesthesia with the patient or authorized representative who has indicated his/her understanding and acceptance.     Dental advisory given  Plan Discussed with: CRNA and Anesthesiologist  Anesthesia Plan  Comments:         Anesthesia Quick Evaluation

## 2021-07-24 NOTE — H&P (Signed)
Please refer to previous H and P and update when procedure originally scheduled last week.

## 2021-07-24 NOTE — Anesthesia Postprocedure Evaluation (Signed)
Anesthesia Post Note  Patient: Kathryn Roberts  Procedure(s) Performed: DILATATION AND EVACUATION     Patient location during evaluation: PACU Anesthesia Type: MAC Level of consciousness: awake and alert and oriented Pain management: pain level controlled Vital Signs Assessment: post-procedure vital signs reviewed and stable Respiratory status: spontaneous breathing, nonlabored ventilation and respiratory function stable Cardiovascular status: stable and blood pressure returned to baseline Postop Assessment: no apparent nausea or vomiting Anesthetic complications: no   No notable events documented.  Last Vitals:  Vitals:   07/24/21 1335 07/24/21 1350  BP: (!) 94/55 (!) 105/53  Pulse: (!) 55 (!) 54  Resp: (!) 7 14  Temp: (!) 36.3 C   SpO2: 100% 97%    Last Pain:  Vitals:   07/24/21 1335  TempSrc:   PainSc: 5                  Cameka Rae A.

## 2021-07-24 NOTE — Transfer of Care (Signed)
Immediate Anesthesia Transfer of Care Note  Patient: Kathryn Roberts  Procedure(s) Performed: DILATATION AND EVACUATION  Patient Location: PACU  Anesthesia Type:MAC  Level of Consciousness: awake, patient cooperative and responds to stimulation  Airway & Oxygen Therapy: Patient Spontanous Breathing  Post-op Assessment: Report given to RN and Post -op Vital signs reviewed and stable  Post vital signs: Reviewed and stable  Last Vitals:  Vitals Value Taken Time  BP 94/55 07/24/21 1336  Temp    Pulse 66 07/24/21 1337  Resp 13 07/24/21 1337  SpO2 100 % 07/24/21 1337  Vitals shown include unvalidated device data.  Last Pain:  Vitals:   07/24/21 1115  TempSrc:   PainSc: 3          Complications: No notable events documented.

## 2021-07-24 NOTE — Brief Op Note (Signed)
07/24/2021  1:33 PM  PATIENT:  Kathryn Roberts  35 y.o. female  PRE-OPERATIVE DIAGNOSIS:  Retained products of conception  POST-OPERATIVE DIAGNOSIS:  same  PROCEDURE:  Procedure(s): DILATATION AND EVACUATION (N/A)  SURGEON:  Surgeon(s) and Role:    * Dian Queen, MD - Primary  PHYSICIAN ASSISTANT:   ASSISTANTS: none   ANESTHESIA:   IV sedation and paracervical block  EBL:  15 mL   BLOOD ADMINISTERED:none  DRAINS: none   LOCAL MEDICATIONS USED:  LIDOCAINE   SPECIMEN:  Source of Specimen:  poc  DISPOSITION OF SPECIMEN:  PATHOLOGY  COUNTS:  YES  TOURNIQUET:  * No tourniquets in log *  DICTATION: .Other Dictation: Dictation Number dictated  PLAN OF CARE: Discharge to home after PACU  PATIENT DISPOSITION:  PACU - hemodynamically stable.   Delay start of Pharmacological VTE agent (>24hrs) due to surgical blood loss or risk of bleeding: not applicable

## 2021-07-25 ENCOUNTER — Encounter (HOSPITAL_COMMUNITY): Payer: Self-pay | Admitting: Obstetrics and Gynecology

## 2021-07-25 LAB — RPR: RPR Ser Ql: NONREACTIVE

## 2021-07-25 LAB — SURGICAL PATHOLOGY

## 2021-08-07 DIAGNOSIS — R5383 Other fatigue: Secondary | ICD-10-CM | POA: Diagnosis not present

## 2021-08-07 DIAGNOSIS — R399 Unspecified symptoms and signs involving the genitourinary system: Secondary | ICD-10-CM | POA: Diagnosis not present

## 2021-09-03 ENCOUNTER — Encounter: Payer: Self-pay | Admitting: Emergency Medicine

## 2021-09-03 ENCOUNTER — Other Ambulatory Visit: Payer: Self-pay

## 2021-09-03 ENCOUNTER — Ambulatory Visit
Admission: EM | Admit: 2021-09-03 | Discharge: 2021-09-03 | Disposition: A | Discharge status: Home / Self Care | Payer: BC Managed Care – PPO | Attending: Urgent Care | Admitting: Urgent Care

## 2021-09-03 DIAGNOSIS — Z20828 Contact with and (suspected) exposure to other viral communicable diseases: Secondary | ICD-10-CM

## 2021-09-03 DIAGNOSIS — B349 Viral infection, unspecified: Secondary | ICD-10-CM | POA: Diagnosis not present

## 2021-09-03 MED ORDER — PSEUDOEPHEDRINE HCL 30 MG PO TABS: 30.0000 mg | ORAL_TABLET | Freq: Three times a day (TID) | ORAL | 0 refills | Status: DC | PRN

## 2021-09-03 MED ORDER — BENZONATATE 100 MG PO CAPS: 100.0000 mg | ORAL_CAPSULE | Freq: Three times a day (TID) | ORAL | 0 refills | Status: DC | PRN

## 2021-09-03 MED ORDER — PROMETHAZINE-DM 6.25-15 MG/5ML PO SYRP: 5.0000 mL | ORAL_SOLUTION | Freq: Every evening | ORAL | 0 refills | Status: DC | PRN

## 2021-09-03 MED ORDER — CETIRIZINE HCL 10 MG PO TABS: 10.0000 mg | ORAL_TABLET | Freq: Every day | ORAL | 0 refills | Status: DC

## 2021-09-03 MED ORDER — OSELTAMIVIR PHOSPHATE 75 MG PO CAPS: 75.0000 mg | ORAL_CAPSULE | Freq: Two times a day (BID) | ORAL | 0 refills | Status: DC

## 2021-09-03 NOTE — ED Provider Notes (Signed)
[place]   MRN: [id number] DOB: 1986/07/01  Subjective:   Kathryn Roberts is a 35 y.o. female presenting for 2-day history of acute onset coughing, hoarseness, congestion, throat pain.  Her daughter tested positive for flu yesterday.  Has had antibiotics recently for an UTI related to a miscarriage, D&E.  No chest pain, shortness of breath or wheezing, history of respiratory disorders.  Patient is non-smoker.  No alcohol use.  No current facility-administered medications for this encounter.  Current Outpatient Medications:    ALPRAZolam (XANAX) 0.5 MG tablet, Take 0.125 mg by mouth daily as needed for anxiety., Disp: , Rfl:    acetaminophen (TYLENOL) 500 MG tablet, Take 1,000 mg by mouth every 6 (six) hours as needed for mild pain., Disp: , Rfl:    calcium carbonate (TUMS - DOSED IN MG ELEMENTAL CALCIUM) 500 MG chewable tablet, Chew 2 tablets by mouth daily as needed for indigestion., Disp: , Rfl:    Carboxymethylcellul-Glycerin (LUBRICATING EYE DROPS OP), Place 1 drop into both eyes daily as needed (dry eyes)., Disp: , Rfl:    Allergies  Allergen Reactions   Codeine Nausea Only   Hydrocodone-Acetaminophen Nausea Only   Other Nausea And Vomiting    General anesthesia    Shellfish Allergy Diarrhea and Nausea And Vomiting    Pt reports extreme n/v/d after eating shelfish    Past Medical History:  Diagnosis Date   ADD (attention deficit disorder)    Anemia    low iron   Anxiety    COVID    Depression    Family history of colon cancer    GERD (gastroesophageal reflux disease)    H/O varicella    History of adenomatous polyp of colon    History of gastric ulcer    History of kidney stones    History of shingles    2012--  residual intermittant rash   IBS (irritable bowel syndrome)    PONV (postoperative nausea and vomiting)    SUI (stress urinary incontinence, female)      Past Surgical History:  Procedure Laterality Date   CESAREAN SECTION N/A  02/10/2017   Procedure: CESAREAN SECTION;  Surgeon: Tyson Dense, MD;  Location: [facility];  Service: Obstetrics;  Laterality: N/A;   COLONOSCOPY W/ POLYPECTOMY  2007, 2011, 2017   rectal adenoma - 07   DILATION AND EVACUATION N/A 07/24/2021   Procedure: DILATATION AND EVACUATION;  Surgeon: Dian Queen, MD;  Location: [place];  Service: Gynecology;  Laterality: N/A;   LAPAROSCOPIC CHOLECYSTECTOMY  10-18-2005   PUBOVAGINAL SLING N/A 10/10/2014   Procedure: Gaynelle Arabian;  Surgeon: Ailene Rud, MD;  Location: [facility];  Service: Urology;  Laterality: N/A;   REPAIR LACERATION OF FOREHEAD  age 65    Family History  Problem Relation Age of Onset   Hypertension Mother    Alcohol abuse Brother    Diabetes Maternal Uncle    Diabetes Paternal Aunt    Seizures Paternal Uncle    Diabetes Paternal Uncle    Hypertension Maternal Grandmother    Diabetes Maternal Grandmother    Hypertension Maternal Grandfather    Cancer Maternal Grandfather        prostate   Heart disease Paternal Grandmother    Diabetes Paternal Grandmother    Cancer Cousin        pancreatic   Diabetes Paternal Uncle     Social History   Tobacco Use   Smoking status: Never   Smokeless tobacco:  Never  Vaping Use   Vaping Use: Never used  Substance Use Topics   Alcohol use: Not Currently    Alcohol/week: 7.0 standard drinks    Types: 7 Glasses of wine per week    Comment: one wine daily   Drug use: No    ROS   Objective:   Vitals: BP 121/79   Pulse 84   Temp 99.7 F (37.6 C) (Oral)   Resp 16   LMP 08/30/2021   SpO2 99%   Breastfeeding No   Physical Exam Constitutional:      General: She is not in acute distress.    Appearance: Normal appearance. She is well-developed. She is not ill-appearing, toxic-appearing or diaphoretic.  HENT:     Head: Normocephalic and atraumatic.     Right Ear: Tympanic membrane, ear canal and external ear normal. No  drainage or tenderness. No middle ear effusion. There is no impacted cerumen. Tympanic membrane is not erythematous.     Left Ear: Tympanic membrane, ear canal and external ear normal. No drainage or tenderness.  No middle ear effusion. There is no impacted cerumen. Tympanic membrane is not erythematous.     Nose: Nose normal. No congestion or rhinorrhea.     Mouth/Throat:     Mouth: Mucous membranes are moist. No oral lesions.     Pharynx: No pharyngeal swelling, oropharyngeal exudate, posterior oropharyngeal erythema or uvula swelling.     Tonsils: No tonsillar exudate or tonsillar abscesses.  Eyes:     General: No scleral icterus.       Right eye: No discharge.        Left eye: No discharge.     Extraocular Movements: Extraocular movements intact.     Right eye: Normal extraocular motion.     Left eye: Normal extraocular motion.     Conjunctiva/sclera: Conjunctivae normal.     Pupils: Pupils are equal, round, and reactive to light.  Cardiovascular:     Rate and Rhythm: Normal rate and regular rhythm.     Pulses: Normal pulses.     Heart sounds: Normal heart sounds. No murmur heard.   No friction rub. No gallop.  Pulmonary:     Effort: Pulmonary effort is normal. No respiratory distress.     Breath sounds: Normal breath sounds. No stridor. No wheezing, rhonchi or rales.  Musculoskeletal:     Cervical back: Normal range of motion and neck supple.  Lymphadenopathy:     Cervical: No cervical adenopathy.  Skin:    General: Skin is warm and dry.     Findings: No rash.  Neurological:     General: No focal deficit present.     Mental Status: She is alert and oriented to person, place, and time.  Psychiatric:        Mood and Affect: Mood normal.        Behavior: Behavior normal.        Thought Content: Thought content normal.    Assessment and Plan :   PDMP not reviewed this encounter.  1. Acute viral syndrome   2. Exposure to influenza    Testing pending. Deferred imaging  given clear cardiopulmonary exam, hemodynamically stable vital signs. Will cover for influenza with Tamiflu given symptom set, acute onset, physical exam findings.  Use supportive care, rest, fluids, hydration, light meals, schedule Tylenol and ibuprofen. Counseled patient on potential for adverse effects with medications prescribed today, patient verbalized understanding. ER and return-to-clinic precautions discussed, patient verbalized understanding.  Jaynee Eagles, PA-C 09/03/21 1517

## 2021-09-03 NOTE — ED Triage Notes (Signed)
Congestion and cough for 2 days. Her daughter had a positive flu test yesterday.

## 2021-09-04 LAB — COVID-19, FLU A+B NAA
Influenza A, NAA: NOT DETECTED
Influenza B, NAA: NOT DETECTED
SARS-CoV-2, NAA: NOT DETECTED

## 2021-11-07 DIAGNOSIS — N760 Acute vaginitis: Secondary | ICD-10-CM | POA: Diagnosis not present

## 2021-11-07 DIAGNOSIS — R300 Dysuria: Secondary | ICD-10-CM | POA: Diagnosis not present

## 2021-11-21 ENCOUNTER — Other Ambulatory Visit: Payer: Self-pay

## 2021-11-21 ENCOUNTER — Ambulatory Visit (AMBULATORY_SURGERY_CENTER): Payer: Self-pay

## 2021-11-21 VITALS — Ht 64.0 in | Wt 145.0 lb

## 2021-11-21 DIAGNOSIS — Z86010 Personal history of colonic polyps: Secondary | ICD-10-CM

## 2021-11-21 NOTE — Progress Notes (Signed)
Denies allergies to eggs or soy products. Denies complication of anesthesia or sedation. Denies use of weight loss medication. Denies use of O2.   Emmi instructions given for colonoscopy.  

## 2021-11-22 ENCOUNTER — Encounter: Payer: Self-pay | Admitting: Internal Medicine

## 2021-11-22 DIAGNOSIS — J019 Acute sinusitis, unspecified: Secondary | ICD-10-CM | POA: Diagnosis not present

## 2021-11-28 ENCOUNTER — Encounter: Payer: BC Managed Care – PPO | Admitting: Internal Medicine

## 2021-12-05 ENCOUNTER — Telehealth: Payer: Self-pay | Admitting: Physician Assistant

## 2021-12-05 NOTE — Telephone Encounter (Signed)
Patient reports she has had abdominal pain, burping and reflux. She takes TUMS on a regular basis. Reports black stools. Appointment made for evaluation 12/10/21. Patient last seen in office 12/2019.

## 2021-12-05 NOTE — Telephone Encounter (Signed)
Patient would like to know if they can tell if she has stomach ulcers when she gets her colonoscopy done. Please advise.

## 2021-12-10 ENCOUNTER — Ambulatory Visit: Payer: BC Managed Care – PPO | Admitting: Internal Medicine

## 2021-12-10 ENCOUNTER — Encounter: Payer: Self-pay | Admitting: Internal Medicine

## 2021-12-10 ENCOUNTER — Other Ambulatory Visit (INDEPENDENT_AMBULATORY_CARE_PROVIDER_SITE_OTHER): Payer: BC Managed Care – PPO

## 2021-12-10 VITALS — BP 100/60 | HR 68 | Ht 64.0 in | Wt 148.0 lb

## 2021-12-10 DIAGNOSIS — K921 Melena: Secondary | ICD-10-CM

## 2021-12-10 DIAGNOSIS — R1012 Left upper quadrant pain: Secondary | ICD-10-CM

## 2021-12-10 DIAGNOSIS — Z86010 Personal history of colonic polyps: Secondary | ICD-10-CM

## 2021-12-10 LAB — CBC WITH DIFFERENTIAL/PLATELET
Basophils Absolute: 0 10*3/uL (ref 0.0–0.1)
Basophils Relative: 0.5 % (ref 0.0–3.0)
Eosinophils Absolute: 0.1 10*3/uL (ref 0.0–0.7)
Eosinophils Relative: 1.7 % (ref 0.0–5.0)
HCT: 38.8 % (ref 36.0–46.0)
Hemoglobin: 13.3 g/dL (ref 12.0–15.0)
Lymphocytes Relative: 30 % (ref 12.0–46.0)
Lymphs Abs: 2.4 10*3/uL (ref 0.7–4.0)
MCHC: 34.1 g/dL (ref 30.0–36.0)
MCV: 92.6 fl (ref 78.0–100.0)
Monocytes Absolute: 0.6 10*3/uL (ref 0.1–1.0)
Monocytes Relative: 7.5 % (ref 3.0–12.0)
Neutro Abs: 4.7 10*3/uL (ref 1.4–7.7)
Neutrophils Relative %: 60.3 % (ref 43.0–77.0)
Platelets: 271 10*3/uL (ref 150.0–400.0)
RBC: 4.19 Mil/uL (ref 3.87–5.11)
RDW: 13 % (ref 11.5–15.5)
WBC: 7.9 10*3/uL (ref 4.0–10.5)

## 2021-12-10 MED ORDER — OMEPRAZOLE MAGNESIUM 20 MG PO TBEC: 20.0000 mg | DELAYED_RELEASE_TABLET | Freq: Every day | ORAL | Status: DC

## 2021-12-10 NOTE — Patient Instructions (Signed)
You have been scheduled for an endoscopy and colonoscopy. Please follow the written instructions given to you at your visit today. Please pick up your prep supplies at the pharmacy within the next 1-3 days. If you use inhalers (even only as needed), please bring them with you on the day of your procedure.  Your provider has requested that you go to the basement level for lab work before leaving today. Press "B" on the elevator. The lab is located at the first door on the left as you exit the elevator.  Due to recent changes in healthcare laws, you may see the results of your imaging and laboratory studies on MyChart before your provider has had a chance to review them.  We understand that in some cases there may be results that are confusing or concerning to you. Not all laboratory results come back in the same time frame and the provider may be waiting for multiple results in order to interpret others.  Please give [place] 48 hours in order for your provider to thoroughly review all the results before contacting the office for clarification of your results.   Please take over the counter prilosec twice daily: 30 minutes prior to breakfast and supper.  I appreciate the opportunity to care for you. Silvano Rusk, MD, [facility]

## 2021-12-10 NOTE — Progress Notes (Signed)
Kathryn Roberts 36 y.o. Nov 02, 1985 [id number]  Assessment & Plan:   Encounter Diagnoses  Name Primary?   LUQ pain Yes   Melena    Personal history of colonic polyps     It sounds like she may be having upper GI bleeding perhaps gastritis or an ulcer.  She also has a history of colon polyps.  I do not think this is colonic bleeding based upon the story though that is not excluded either.  Evaluate with EGD and colonoscopy  The risks and benefits as well as alternatives of endoscopic procedure(s) have been discussed and reviewed. All questions answered. The patient agrees to proceed.   Start twice daily over-the-counter PPI for the time being and lab evaluation as below  Orders Placed This Encounter  Procedures   CBC with Differential/Platelet   Comprehensive metabolic panel   H. pylori antigen, stool   Ambulatory referral to Gastroenterology   CC: [facility]    Subjective:   Chief Complaint: Abdominal pain and dark stools  HPI 36 year old white woman known to me with a history of IBS and colon polyps with most recent procedure in 2017 at which colonoscopy demonstrated a diminutive adenoma who presents because of a new onset of left-sided abdominal pain and dark black stools in the last week or so.  She went on a cruise in late December and everyone of the party was sick with nausea vomiting diarrhea consistent with a gastroenteritis.  She got over that but in the last week or so she has had this pain has not been severe but feels better if she eats respond somewhat to Tums and having increased belching.  She describes black stool several times a day.  Over the years her IBS has gotten better by eating real food and eliminating processed foods.  Still has some problems but this is different.  She had presented for a colonoscopy previsit in late January and this cropped up and she called for an appointment.  She is not using any anti-inflammatories or  NSAIDs.  She did have to take a lot of antibiotics in the fall because she had a miscarriage and there was a delayed passage of and retained concepts of products of conception.  She does take omeprazole OTC but rarely and has not been using it regularly lately.  Wt Readings from Last 3 Encounters:  12/10/21 148 lb (67.1 kg)  11/21/21 145 lb (65.8 kg)  07/24/21 145 lb (65.8 kg)     Allergies  Allergen Reactions   Codeine Nausea Only   Hydrocodone-Acetaminophen Nausea Only   Other Nausea And Vomiting    General anesthesia    Shellfish Allergy Diarrhea and Nausea And Vomiting    Pt reports extreme n/v/d after eating shelfish   Current Meds  Medication Sig   acetaminophen (TYLENOL) 500 MG tablet Take 1,000 mg by mouth every 6 (six) hours as needed for mild pain.   ALPRAZolam (XANAX) 0.5 MG tablet Take 0.125 mg by mouth daily as needed for anxiety.   calcium carbonate (TUMS - DOSED IN MG ELEMENTAL CALCIUM) 500 MG chewable tablet Chew 2 tablets by mouth daily as needed for indigestion.   Carboxymethylcellul-Glycerin (LUBRICATING EYE DROPS OP) Place 1 drop into both eyes daily as needed (dry eyes).   norethindrone-ethinyl estradiol-FE (LOESTRIN FE) 1-20 MG-MCG tablet Take 1 tablet by mouth daily.   omeprazole (PRILOSEC OTC) 20 MG tablet Take 1 tablet (20 mg total) by mouth daily before breakfast.   Past  Medical History:  Diagnosis Date   ADD (attention deficit disorder)    Allergy    Anemia    low iron   Anxiety    COVID    Depression    Family history of colon cancer    GERD (gastroesophageal reflux disease)    H/O varicella    History of adenomatous polyp of colon    History of gastric ulcer    History of kidney stones    History of shingles    2012--  residual intermittant rash   IBS (irritable bowel syndrome)    PONV (postoperative nausea and vomiting)    SUI (stress urinary incontinence, female)    Past Surgical History:  Procedure Laterality Date   CESAREAN SECTION  N/A 02/10/2017   Procedure: CESAREAN SECTION;  Surgeon: Tyson Dense, MD;  Location: [place];  Service: Obstetrics;  Laterality: N/A;   COLONOSCOPY W/ POLYPECTOMY  2007, 2011, 2017   rectal adenoma - 07   DILATION AND EVACUATION N/A 07/24/2021   Procedure: DILATATION AND EVACUATION;  Surgeon: Dian Queen, MD;  Location: [place];  Service: Gynecology;  Laterality: N/A;   LAPAROSCOPIC CHOLECYSTECTOMY  10-18-2005   PUBOVAGINAL SLING N/A 10/10/2014   Procedure: Gaynelle Arabian;  Surgeon: Ailene Rud, MD;  Location: [facility];  Service: Urology;  Laterality: N/A;   REPAIR LACERATION OF FOREHEAD  age 60   Social History   Social History Narrative   Patient is married with school-aged children   Never smoker 1 glass of wine daily on average no drug use   family history includes Alcohol abuse in her brother; Cancer in her cousin and maternal grandfather; Colon cancer in an other family member; Diabetes in her maternal grandmother, maternal uncle, paternal aunt, paternal grandmother, paternal uncle, and paternal uncle; Heart disease in her paternal grandmother; Hypertension in her maternal grandfather, maternal grandmother, and mother; Seizures in her paternal uncle; Stomach cancer in her maternal grandmother.   Review of Systems See HPI  Objective:   Physical Exam BP 100/60    Pulse 68    Ht 5\' 4"  (1.626 m)    Wt 148 lb (67.1 kg)    LMP 05/15/2021    BMI 25.40 kg/m  Lungs cta Cor S1S2 no rmg Abd soft NT no HSM/mass, ribs ok and neg Carnett's Rectal Julieanne Cotton CMA was present for rectal exam  Black heme + stool

## 2021-12-11 ENCOUNTER — Encounter: Payer: Self-pay | Admitting: Internal Medicine

## 2021-12-11 ENCOUNTER — Telehealth: Payer: Self-pay | Admitting: Internal Medicine

## 2021-12-11 LAB — COMPREHENSIVE METABOLIC PANEL
ALT: 11 U/L (ref 0–35)
AST: 13 U/L (ref 0–37)
Albumin: 4.3 g/dL (ref 3.5–5.2)
Alkaline Phosphatase: 42 U/L (ref 39–117)
BUN: 14 mg/dL (ref 6–23)
CO2: 28 mEq/L (ref 19–32)
Calcium: 8.9 mg/dL (ref 8.4–10.5)
Chloride: 106 mEq/L (ref 96–112)
Creatinine, Ser: 0.76 mg/dL (ref 0.40–1.20)
GFR: 99.72 mL/min (ref >60.00)
Glucose, Bld: 84 mg/dL (ref 70–99)
Potassium: 4.1 mEq/L (ref 3.5–5.1)
Sodium: 139 mEq/L (ref 135–145)
Total Bilirubin: 0.3 mg/dL (ref 0.2–1.2)
Total Protein: 6.8 g/dL (ref 6.0–8.3)

## 2021-12-11 NOTE — Telephone Encounter (Signed)
Unable to reach Pt. Voice mailbox full. Unable to leave Message

## 2021-12-11 NOTE — Telephone Encounter (Signed)
Pt requesting labs results from yesterday; Pt notified that results are not available yet and  as soon at the results are available and Dr. Carlean Purl reviews them then we will reach out to her and make her aware: Pt verbalized understanding with all questions answered.

## 2021-12-11 NOTE — Telephone Encounter (Signed)
Patient had bloodwork and stool samples taken yesterday and is calling to get the results.  Please call patient and advise.  Thank you.

## 2021-12-13 ENCOUNTER — Ambulatory Visit (AMBULATORY_SURGERY_CENTER): Payer: BC Managed Care – PPO | Admitting: Internal Medicine

## 2021-12-13 ENCOUNTER — Encounter: Payer: Self-pay | Admitting: Internal Medicine

## 2021-12-13 VITALS — BP 116/78 | HR 51 | Temp 97.5°F | Resp 12 | Ht 64.0 in | Wt 148.0 lb

## 2021-12-13 DIAGNOSIS — K921 Melena: Secondary | ICD-10-CM | POA: Diagnosis not present

## 2021-12-13 DIAGNOSIS — K319 Disease of stomach and duodenum, unspecified: Secondary | ICD-10-CM | POA: Diagnosis not present

## 2021-12-13 DIAGNOSIS — D123 Benign neoplasm of transverse colon: Secondary | ICD-10-CM

## 2021-12-13 DIAGNOSIS — K259 Gastric ulcer, unspecified as acute or chronic, without hemorrhage or perforation: Secondary | ICD-10-CM

## 2021-12-13 DIAGNOSIS — K2970 Gastritis, unspecified, without bleeding: Secondary | ICD-10-CM

## 2021-12-13 DIAGNOSIS — Z86010 Personal history of colonic polyps: Secondary | ICD-10-CM | POA: Diagnosis not present

## 2021-12-13 DIAGNOSIS — Z1211 Encounter for screening for malignant neoplasm of colon: Secondary | ICD-10-CM | POA: Diagnosis not present

## 2021-12-13 DIAGNOSIS — R1012 Left upper quadrant pain: Secondary | ICD-10-CM

## 2021-12-13 LAB — H. PYLORI ANTIGEN, STOOL: H pylori Ag, Stl: NEGATIVE

## 2021-12-13 MED ORDER — SODIUM CHLORIDE 0.9 % IV SOLN: 500.0000 mL | Freq: Once | INTRAVENOUS | Status: DC

## 2021-12-13 MED ORDER — OMEPRAZOLE 40 MG PO CPDR: 40.0000 mg | DELAYED_RELEASE_CAPSULE | Freq: Every day | ORAL | 0 refills | Status: DC

## 2021-12-13 NOTE — Progress Notes (Signed)
Report given to PACU, vss 

## 2021-12-13 NOTE — Progress Notes (Signed)
1112 Robinul 0.1 mg IV given due large amount of secretions upon assessment.  MD made aware, vss

## 2021-12-13 NOTE — Op Note (Signed)
[place] Patient Name: Kathryn Roberts Procedure Date: 12/13/2021 11:09 AM MRN: [id number] Endoscopist: Gatha Mayer , MD Age: 36 Referring MD:  Date of Birth: [birth year] Gender: Female Account #: [id number] Procedure:                Colonoscopy Indications:              Surveillance: Personal history of adenomatous                            polyps on last colonoscopy > 5 years ago, Last                            colonoscopy: 2017 Medicines:                Propofol per Anesthesia, Monitored Anesthesia Care Procedure:                Pre-Anesthesia Assessment:                           - Prior to the procedure, a History and Physical                            was performed, and patient medications and                            allergies were reviewed. The patient's tolerance of                            previous anesthesia was also reviewed. The risks                            and benefits of the procedure and the sedation                            options and risks were discussed with the patient.                            All questions were answered, and informed consent                            was obtained. Prior Anticoagulants: The patient has                            taken no previous anticoagulant or antiplatelet                            agents. ASA Grade Assessment: II - A patient with                            mild systemic disease. After reviewing the risks                            and benefits, the patient was deemed in  satisfactory condition to undergo the procedure.                           After obtaining informed consent, the colonoscope                            was passed under direct vision. Throughout the                            procedure, the patient's blood pressure, pulse, and                            oxygen saturations were monitored continuously. The                            Olympus PCF-H190DL  (#[id number]) Colonoscope was                            introduced through the anus and advanced to the the                            terminal ileum, with identification of the                            appendiceal orifice and IC valve. The colonoscopy                            was performed without difficulty. The patient                            tolerated the procedure well. The quality of the                            bowel preparation was excellent. The terminal                            ileum, ileocecal valve, appendiceal orifice, and                            rectum were photographed. The bowel preparation                            used was Miralax via split dose instruction. Scope In: 11:29:58 AM Scope Out: 11:44:36 AM Scope Withdrawal Time: 0 hours 10 minutes 15 seconds  Total Procedure Duration: 0 hours 14 minutes 38 seconds  Findings:                 The perianal and digital rectal examinations were                            normal.                           A diminutive polyp was found in the transverse  colon. The polyp was sessile. The polyp was removed                            with a cold snare. Resection and retrieval were                            complete. Verification of patient identification                            for the specimen was done. Estimated blood loss was                            minimal.                           The exam was otherwise without abnormality on                            direct and retroflexion views. Complications:            No immediate complications. Estimated Blood Loss:     Estimated blood loss was minimal. Impression:               - One diminutive polyp in the transverse colon,                            removed with a cold snare. Resected and retrieved.                           - The examination was otherwise normal on direct                            and retroflexion views.                            - Personal history of colonic polyps. 2007 rectal                            adenoma                           2011 no polyps                           02/2016 - 2 mm cecal polyp - adenoma Recommendation:           - Patient has a contact number available for                            emergencies. The signs and symptoms of potential                            delayed complications were discussed with the                            patient. Return to normal activities tomorrow.  Written discharge instructions were provided to the                            patient.                           - Resume previous diet.                           - Continue present medications.                           - Repeat colonoscopy is recommended for                            surveillance. The colonoscopy date will be                            determined after pathology results from today's                            exam become available for review. Gatha Mayer, MD 12/13/2021 11:59:54 AM This report has been signed electronically.

## 2021-12-13 NOTE — Patient Instructions (Addendum)
Handouts were given to your care partner on polyps and gastritis. Start taking OMEPRAZOLE 40 mg  daily.  Best to take on an empty stomach 20-30 minutes before breakfast. You may resume your other current medications today. Await biopsy results.  May take 1-3 weeks to receive pathology results. Please call if any questions or concerns.       YOU HAD AN ENDOSCOPIC PROCEDURE TODAY AT [facility]:   Refer to the procedure report that was given to you for any specific questions about what was found during the examination.  If the procedure report does not answer your questions, please call your gastroenterologist to clarify.  If you requested that your care partner not be given the details of your procedure findings, then the procedure report has been included in a sealed envelope for you to review at your convenience later.  YOU SHOULD EXPECT: Some feelings of bloating in the abdomen. Passage of more gas than usual.  Walking can help get rid of the air that was put into your GI tract during the procedure and reduce the bloating. If you had a lower endoscopy (such as a colonoscopy or flexible sigmoidoscopy) you may notice spotting of blood in your stool or on the toilet paper. If you underwent a bowel prep for your procedure, you may not have a normal bowel movement for a few days.  Please Note:  You might notice some irritation and congestion in your nose or some drainage.  This is from the oxygen used during your procedure.  There is no need for concern and it should clear up in a day or so.  SYMPTOMS TO REPORT IMMEDIATELY:  Following lower endoscopy (colonoscopy or flexible sigmoidoscopy):  Excessive amounts of blood in the stool  Significant tenderness or worsening of abdominal pains  Swelling of the abdomen that is new, acute  Fever of 100F or higher  Following upper endoscopy (EGD)  Vomiting of blood or coffee ground material  New chest pain or pain under the shoulder  blades  Painful or persistently difficult swallowing  New shortness of breath  Fever of 100F or higher  Black, tarry-looking stools  For urgent or emergent issues, a gastroenterologist can be reached at any hour by calling [phone]. Do not use MyChart messaging for urgent concerns.    DIET:  We do recommend a small meal at first, but then you may proceed to your regular diet.  Drink plenty of fluids but you should avoid alcoholic beverages for 24 hours.  ACTIVITY:  You should plan to take it easy for the rest of today and you should NOT DRIVE or use heavy machinery until tomorrow (because of the sedation medicines used during the test).    FOLLOW UP: Our staff will call the number listed on your records 48-72 hours following your procedure to check on you and address any questions or concerns that you may have regarding the information given to you following your procedure. If we do not reach you, we will leave a message.  We will attempt to reach you two times.  During this call, we will ask if you have developed any symptoms of COVID 19. If you develop any symptoms (ie: fever, flu-like symptoms, shortness of breath, cough etc.) before then, please call [phone].  If you test positive for Covid 19 in the 2 weeks post procedure, please call and report this information to [place].    If any biopsies were taken you will be contacted by  phone or by letter within the next 1-3 weeks.  Please call us at [phone] if you have not heard about the biopsies in 3 weeks.    SIGNATURES/CONFIDENTIALITY: You and/or your care partner have signed paperwork which will be entered into your electronic medical record.  These signatures attest to the fact that that the information above on your After Visit Summary has been reviewed and is understood.  Full responsibility of the confidentiality of this discharge information lies with you and/or your care-partner.

## 2021-12-13 NOTE — Progress Notes (Signed)
No problems noted in the recovery room. maw 

## 2021-12-13 NOTE — Progress Notes (Signed)
Pt's states no medical or surgical changes since previsit or office visit. 

## 2021-12-13 NOTE — Progress Notes (Signed)
History and Physical Interval Note:  12/13/2021 11:20 AM  Kathryn Roberts  has presented today for endoscopic procedure(s), with the diagnosis of  Encounter Diagnoses  Name Primary?   LUQ pain Yes   Personal history of colonic polyps   .  The various methods of evaluation and treatment have been discussed with the patient and/or family. After consideration of risks, benefits and other options for treatment, the patient has consented to  the endoscopic procedure(s).   The patient's history has been reviewed, patient examined, no change in status, stable for endoscopic procedure(s).  I have reviewed the patient's chart and labs.  Questions were answered to the patient's satisfaction.     Gatha Mayer, MD, [facility]

## 2021-12-13 NOTE — Progress Notes (Signed)
Called to room to assist during endoscopic procedure.  Patient ID and intended procedure confirmed with present staff. Received instructions for my participation in the procedure from the performing physician.  

## 2021-12-13 NOTE — Op Note (Signed)
[facility] Patient Name: Charl Wellen Procedure Date: 12/13/2021 11:10 AM MRN: [id number] Endoscopist: Gatha Mayer , MD Age: 36 Referring MD:  Date of Birth: [birth year] Gender: Female Account #: [id number] Procedure:                Upper GI endoscopy Indications:              Abdominal pain in the left upper quadrant, Melena Medicines:                Propofol per Anesthesia, Monitored Anesthesia Care Procedure:                Pre-Anesthesia Assessment:                           - Prior to the procedure, a History and Physical                            was performed, and patient medications and                            allergies were reviewed. The patient's tolerance of                            previous anesthesia was also reviewed. The risks                            and benefits of the procedure and the sedation                            options and risks were discussed with the patient.                            All questions were answered, and informed consent                            was obtained. Prior Anticoagulants: The patient has                            taken no previous anticoagulant or antiplatelet                            agents. ASA Grade Assessment: II - A patient with                            mild systemic disease. After reviewing the risks                            and benefits, the patient was deemed in                            satisfactory condition to undergo the procedure.                           After obtaining informed consent, the endoscope was  passed under direct vision. Throughout the                            procedure, the patient's blood pressure, pulse, and                            oxygen saturations were monitored continuously. The                            Endoscope was introduced through the mouth, and                            advanced to the second part of duodenum. The upper                             GI endoscopy was accomplished without difficulty.                            The patient tolerated the procedure well. Scope In: Scope Out: Findings:                 The examined esophagus was normal.                           One non-bleeding superficial gastric ulcer with                            pigmented material was found in the gastric antrum.                            The lesion was 4 mm in largest dimension. Biopsies                            were taken with a cold forceps for histology.                            Verification of patient identification for the                            specimen was done. Estimated blood loss was minimal.                           Patchy mild inflammation characterized by erosions,                            erythema and friability was found in the gastric                            antrum. Biopsies were taken with a cold forceps for                            histology. Verification of patient identification  for the specimen was done. Estimated blood loss was                            minimal.                           The examined duodenum was normal.                           The cardia and gastric fundus were normal on                            retroflexion. Complications:            No immediate complications. Estimated Blood Loss:     Estimated blood loss was minimal. Impression:               - Normal esophagus.                           - Non-bleeding gastric ulcer with pigmented                            material. Biopsied.                           - Gastritis. Biopsied.                           - Normal examined duodenum. Recommendation:           - Patient has a contact number available for                            emergencies. The signs and symptoms of potential                            delayed complications were discussed with the                            patient. Return to  normal activities tomorrow.                            Written discharge instructions were provided to the                            patient.                           - Resume previous diet.                           - Continue present medications.                           - Await pathology results.                           - See the other procedure note for  documentation of                            additional recommendations.                           - Start omeprazole 40 mg qd                           H pylori stool ag was negative recently - await                            path here also Gatha Mayer, MD 12/13/2021 11:56:23 AM This report has been signed electronically.

## 2021-12-13 NOTE — Progress Notes (Signed)
C.W. vital signs. 

## 2021-12-18 ENCOUNTER — Telehealth: Payer: Self-pay

## 2021-12-18 NOTE — Telephone Encounter (Signed)
°  Follow up Call-  Call back number 12/13/2021  Post procedure Call Back phone  # [phone]  Permission to leave phone message Yes  Some recent data might be hidden     Patient questions:  Do you have a fever, pain , or abdominal swelling? No. Pain Score  0 *  Have you tolerated food without any problems? Yes.    Have you been able to return to your normal activities? Yes.    Do you have any questions about your discharge instructions: Diet   No. Medications  No. Follow up visit  No.  Do you have questions or concerns about your Care? No.  Actions: * If pain score is 4 or above: No action needed, pain <4.

## 2021-12-24 DIAGNOSIS — H109 Unspecified conjunctivitis: Secondary | ICD-10-CM | POA: Diagnosis not present

## 2021-12-25 DIAGNOSIS — Z01419 Encounter for gynecological examination (general) (routine) without abnormal findings: Secondary | ICD-10-CM | POA: Diagnosis not present

## 2021-12-25 DIAGNOSIS — Z09 Encounter for follow-up examination after completed treatment for conditions other than malignant neoplasm: Secondary | ICD-10-CM | POA: Diagnosis not present

## 2021-12-25 DIAGNOSIS — Z760 Encounter for issue of repeat prescription: Secondary | ICD-10-CM | POA: Diagnosis not present

## 2021-12-25 DIAGNOSIS — Z6825 Body mass index (BMI) 25.0-25.9, adult: Secondary | ICD-10-CM | POA: Diagnosis not present

## 2022-03-22 DIAGNOSIS — M436 Torticollis: Secondary | ICD-10-CM | POA: Diagnosis not present

## 2022-03-22 DIAGNOSIS — S134XXA Sprain of ligaments of cervical spine, initial encounter: Secondary | ICD-10-CM | POA: Diagnosis not present

## 2022-03-22 DIAGNOSIS — M546 Pain in thoracic spine: Secondary | ICD-10-CM | POA: Diagnosis not present

## 2022-03-26 DIAGNOSIS — G44209 Tension-type headache, unspecified, not intractable: Secondary | ICD-10-CM | POA: Diagnosis not present

## 2022-03-26 DIAGNOSIS — M542 Cervicalgia: Secondary | ICD-10-CM | POA: Diagnosis not present

## 2022-07-04 DIAGNOSIS — S134XXA Sprain of ligaments of cervical spine, initial encounter: Secondary | ICD-10-CM | POA: Diagnosis not present

## 2022-07-04 DIAGNOSIS — M436 Torticollis: Secondary | ICD-10-CM | POA: Diagnosis not present

## 2022-07-04 DIAGNOSIS — M546 Pain in thoracic spine: Secondary | ICD-10-CM | POA: Diagnosis not present

## 2022-07-10 DIAGNOSIS — M546 Pain in thoracic spine: Secondary | ICD-10-CM | POA: Diagnosis not present

## 2022-07-10 DIAGNOSIS — S134XXA Sprain of ligaments of cervical spine, initial encounter: Secondary | ICD-10-CM | POA: Diagnosis not present

## 2022-07-10 DIAGNOSIS — M436 Torticollis: Secondary | ICD-10-CM | POA: Diagnosis not present

## 2022-07-11 DIAGNOSIS — Z0000 Encounter for general adult medical examination without abnormal findings: Secondary | ICD-10-CM | POA: Diagnosis not present

## 2022-10-08 ENCOUNTER — Telehealth: Payer: Self-pay | Admitting: Internal Medicine

## 2022-10-08 NOTE — Telephone Encounter (Signed)
Pt stated that she has been having diarrhea and cramping  since Saturday and starting today she is starting to have bright red blood associated with her loose stools: 2 stools today with BRB:  Pt has no SOB, dizziness, chest pain: Pt had an appointment on 10/11/2022 at 10:50: Pt was rescheduled to 12/14 2023 at 10:50 with Dr. Carlean Purl: Pt made aware: Please advise if other recommendations:

## 2022-10-08 NOTE — Telephone Encounter (Signed)
Pt made aware of Dr. Gessner recommendations: Pt verbalized understanding with all questions answered.   

## 2022-10-08 NOTE — Telephone Encounter (Signed)
Patient is bleeding from rectum and she wants to know what she can do to get relief. Please advise.

## 2022-10-08 NOTE — Telephone Encounter (Signed)
Agree with current advice.  If bleeding is profuse would go to the ED.  Brat diet.

## 2022-10-10 ENCOUNTER — Encounter: Payer: Self-pay | Admitting: Internal Medicine

## 2022-10-10 ENCOUNTER — Ambulatory Visit (INDEPENDENT_AMBULATORY_CARE_PROVIDER_SITE_OTHER): Payer: BC Managed Care – PPO | Admitting: Internal Medicine

## 2022-10-10 VITALS — BP 110/64 | HR 70 | Ht 64.0 in | Wt 158.0 lb

## 2022-10-10 DIAGNOSIS — R197 Diarrhea, unspecified: Secondary | ICD-10-CM

## 2022-10-10 NOTE — Patient Instructions (Signed)
Glad your getting better. Advance your BRAT diet as tolerated.   Due to recent changes in healthcare laws, you may see the results of your imaging and laboratory studies on MyChart before your provider has had a chance to review them.  We understand that in some cases there may be results that are confusing or concerning to you. Not all laboratory results come back in the same time frame and the provider may be waiting for multiple results in order to interpret others.  Please give [place] 48 hours in order for your provider to thoroughly review all the results before contacting the office for clarification of your results.    I appreciate the opportunity to care for you. Silvano Rusk, MD

## 2022-10-10 NOTE — Progress Notes (Signed)
Kathryn Roberts y.o. [birth year] [id number]  Assessment & Plan:   Encounter Diagnoses  Name Primary?   Diarrhea of presumed infectious origin Yes   Bloody diarrhea    I think she is improving and anticipate resolution.  Continue supportive care gradually advance diet and return as needed.    Subjective:   Chief Complaint: Bloody diarrhea  HPI 36 year old white woman here because of bloody diarrhea.  About 4 5 days ago she had watery dark black stools.  She did take 1 dose of Pepto-Bismol but that was after the diarrhea started.  She started to feel little better than worse, and 2 days ago passed a large amount of blood with several stools.  Since that time the bleeding is stopped and stools are starting to form up.  She has been sore in her abdomen but no severe cramps no fever but achy throughout her body.  Her husband had a similar illness prior.  He did not have bleeding.  She does have a history of IBS and a history of colon polyps and had an EGD and a colonoscopy in March of this year.  No recent antibiotics.  She had called the other day and I recommended a brat diet and an appointment.  When she was seen in February of this year she was complaining of some black stools as well.  Colonoscopy 12/13/2021 with a diminutive transverse polyp, adenoma.  Recall planned for 2028.  EGD same day showed gastritis and a 4 mm antral ulcer, biopsies negative.  No NSAID history. Allergies  Allergen Reactions   Codeine Nausea Only   Hydrocodone-Acetaminophen Nausea Only   Other Nausea And Vomiting    General anesthesia    Shellfish Allergy Diarrhea and Nausea And Vomiting    Pt reports extreme n/v/d after eating shelfish   Current Meds  Medication Sig   acetaminophen (TYLENOL) 500 MG tablet Take 1,000 mg by mouth every 6 (six) hours as needed for mild pain.   ALPRAZolam (XANAX) 0.5 MG tablet Take 0.125 mg by mouth daily as needed for anxiety.   calcium carbonate (TUMS - DOSED IN MG  ELEMENTAL CALCIUM) 500 MG chewable tablet Chew 2 tablets by mouth daily as needed for indigestion.   Carboxymethylcellul-Glycerin (LUBRICATING EYE DROPS OP) Place 1 drop into both eyes daily as needed (dry eyes).   norethindrone-ethinyl estradiol-FE (LOESTRIN FE) 1-20 MG-MCG tablet Take 1 tablet by mouth daily.   omeprazole (PRILOSEC) 40 MG capsule Take 1 capsule (40 mg total) by mouth daily.   Past Medical History:  Diagnosis Date   ADD (attention deficit disorder)    Allergy    Anemia    low iron   Anxiety    COVID    Depression    Family history of colon cancer    GERD (gastroesophageal reflux disease)    H/O varicella    History of adenomatous polyp of colon    History of gastric ulcer    History of kidney stones    History of shingles    2012--  residual intermittant rash   IBS (irritable bowel syndrome)    PONV (postoperative nausea and vomiting)    SUI (stress urinary incontinence, female)    Past Surgical History:  Procedure Laterality Date   CESAREAN SECTION N/A 02/10/2017   Procedure: CESAREAN SECTION;  Surgeon: Tyson Dense, MD;  Location: [place] [facility];  Service: Obstetrics;  Laterality: N/A;   COLONOSCOPY W/ POLYPECTOMY  2007, 2011, 2017   rectal adenoma -  07   DILATION AND EVACUATION N/A 07/24/2021   Procedure: DILATATION AND EVACUATION;  Surgeon: Dian Queen, MD;  Location: [facility];  Service: Gynecology;  Laterality: N/A;   LAPAROSCOPIC CHOLECYSTECTOMY  10-18-2005   PUBOVAGINAL SLING N/A 10/10/2014   Procedure: Gaynelle Arabian;  Surgeon: Ailene Rud, MD;  Location: [facility];  Service: Urology;  Laterality: N/A;   REPAIR LACERATION OF FOREHEAD  age 20   Social History   Social History Narrative   Patient is married with school-aged children   Never smoker 1 glass of wine daily on average no drug use   family history includes Alcohol abuse in her brother; Cancer in her cousin and maternal grandfather; Colon cancer in  an other family member; Diabetes in her maternal grandmother, maternal uncle, paternal aunt, paternal grandmother, paternal uncle, and paternal uncle; Heart disease in her paternal grandmother; Hypertension in her maternal grandfather, maternal grandmother, and mother; Seizures in her paternal uncle; Stomach cancer in her maternal grandmother.   Review of Systems As above  Objective:   Physical Exam '@BP'$  110/64   Pulse 70   Ht '5\' 4"'$  (1.626 m)   Wt 158 lb (71.7 kg)   BMI 27.12 kg/m @  General:  NAD Eyes:   anicteric Lungs:  clear Heart::  S1S2 no rubs, murmurs or gallops Abdomen:  soft and mildly tender diffusely, BS+ Patti Martinique, CMA present.  Rectal pasty loose stool, heme + no mass    Data Reviewed:  See HPI

## 2022-10-11 ENCOUNTER — Ambulatory Visit: Payer: BC Managed Care – PPO | Admitting: Internal Medicine

## 2023-04-18 ENCOUNTER — Ambulatory Visit: Payer: BC Managed Care – PPO

## 2023-12-30 NOTE — Progress Notes (Signed)
 Assessment and Plan   1. Well adult exam (Primary) -     CBC And Differential; Future; Expected date: 12/30/2023 -     Comprehensive Metabolic Panel; Future; Expected date: 12/30/2023 -     Lipid panel ([facility]); Future; Expected date: 12/30/2023 -     TSH; Future; Expected date: 12/30/2023 2. Diabetes mellitus screening -     Comprehensive Metabolic Panel; Future; Expected date: 12/30/2023 3. Thyroid  disorder screen -     TSH; Future; Expected date: 12/30/2023 4. Lipid screening -     Lipid panel ([facility]); Future; Expected date: 12/30/2023 5. Screening for disorder of blood and blood-forming organs -     CBC And Differential; Future; Expected date: 12/30/2023 6. Elevated blood pressure reading 7. Right upper quadrant abdominal pain -     Amylase; Future -     Lipase; Future 8. Heart palpitations -     ECG 12 lead Other orders -     doxycycline (ADOXA) 100 MG tablet; Take one tablet (100 mg dose) by mouth 2 (two) times daily for 10 days., Starting Tue 12/30/2023, Until Fri 01/09/2024, Normal      Heart palpitations and elevated blood pressure are being mediated by the vagus nerve.  Uncertain etiology of vagus nerve stimulation resulting in his heart palpitations and elevated blood pressure.  Want to take a look at baseline liver and pancreas function.  Getting labs today.  EKG was normal in office.  If no findings on labs then follow-up with gastroenterology for evaluation and discussion.  Should strive for 8 hour sleep opportunity. Short sleep increases risk of cardiovascular disease, diabetes, dementia, and depression.  Exercise 30 minutes 6 days per week cardiovascular and muscular training. Emphasized that you can begin exercise at any age with huge benefits.  Diet should consist of real whole food. If the food did not exist 1,000 years ago do not eat it or drink it. Eat 3 square meals a day with no snacking.  Risks, benefits, and alternatives of the  medications and treatment plan prescribed today were discussed, and patient expressed understanding. Plan follow-up as discussed or as needed if any worsening symptoms or change in condition.     Subjective      Patient presents with  . Annual Exam    Pt presents today for annual exam and ? Stye on eye  Kathryn Roberts presents to establish care for annual exam with labs.  She has acute issues with red swollen eyelid on left upper eyelid.  She also has several episodes of elevated blood pressure.  In the past months she has had 3 episodes in the evening of rapid heart rate, intense heartbeat, and elevated blood pressure.  Occurrences have happened after eating food and at night when she lies down.  She describes feeling heaviness in her chest with a heavy heartbeat.  Her stomach does hurt a little bit.  She does not have a gallbladder.  She does have episodes of rapid transit but has not been occurring regularly.  No change in bowel habits or stool consistency.  She does report a little bit of increased gassiness after eating and proceeding at least 1 of these episodes.  No previous history of heart abnormality nor hypertension.  She is healthy does not smoke and exercises on a daily basis.    Patient history from pre-visit questionnaire     Reviewed and updated this visit by provider: Tobacco  Allergies  Meds  Problems  Med Hx  Surg Hx  Fam Hx      Review of Systems  No headache, shortness of breath, nausea/vomiting/diarrhea/constipation, chronic muscle or joint pain, anxiety or depression. Objective   Vitals:   12/30/23 0832  BP: 117/78  Patient Position: Sitting  Pulse: 70  Temp: 98.2 F (36.8 C)  TempSrc: Oral  Resp: 17  Height: 5' 3 (1.6 m)  Weight: 152 lb 3.2 oz (69 kg)  SpO2: 99%  BMI (Calculated): 27    Physical Exam Vitals and nursing note reviewed.  Constitutional:      Appearance: Normal appearance.  HENT:     Head: Normocephalic and atraumatic.     Right Ear:  Tympanic membrane, ear canal and external ear normal.     Left Ear: Tympanic membrane, ear canal and external ear normal.     Nose: Nose normal.     Mouth/Throat:     Mouth: Mucous membranes are moist.  Eyes:     Extraocular Movements: Extraocular movements intact.     Conjunctiva/sclera: Conjunctivae normal.     Pupils: Pupils are equal, round, and reactive to light.  Cardiovascular:     Rate and Rhythm: Normal rate and regular rhythm.     Pulses: Normal pulses.     Heart sounds: Normal heart sounds.  Musculoskeletal:        General: Normal range of motion.     Cervical back: Normal range of motion and neck supple.  Pulmonary:     Effort: Pulmonary effort is normal.     Breath sounds: Normal breath sounds.  Abdominal:     General: Abdomen is flat. Bowel sounds are normal. There is no distension.     Palpations: Abdomen is soft.     Tenderness: There is abdominal tenderness. There is no guarding.     Comments: Mild tenderness to palpation right upper quadrant abdomen.  Skin:    General: Skin is warm and dry.     Capillary Refill: Capillary refill takes less than 2 seconds.  Neurological:     General: No focal deficit present.     Mental Status: She is alert and oriented to person, place, and time.  Psychiatric:        Mood and Affect: Mood normal.        Behavior: Behavior normal.        Thought Content: Thought content normal.        Judgment: Judgment normal.

## 2024-07-12 ENCOUNTER — Inpatient Hospital Stay (HOSPITAL_COMMUNITY)

## 2024-07-12 ENCOUNTER — Inpatient Hospital Stay (HOSPITAL_COMMUNITY)
Admission: AD | Admit: 2024-07-12 | Discharge: 2024-07-12 | Disposition: A | Discharge status: Home / Self Care | Attending: Obstetrics and Gynecology | Admitting: Obstetrics and Gynecology

## 2024-07-12 DIAGNOSIS — O039 Complete or unspecified spontaneous abortion without complication: Secondary | ICD-10-CM | POA: Diagnosis present

## 2024-07-12 DIAGNOSIS — O036 Delayed or excessive hemorrhage following complete or unspecified spontaneous abortion: Secondary | ICD-10-CM | POA: Insufficient documentation

## 2024-07-12 DIAGNOSIS — N939 Abnormal uterine and vaginal bleeding, unspecified: Secondary | ICD-10-CM

## 2024-07-12 LAB — CBC
HCT: 35.6 % — ABNORMAL LOW (ref 36.0–46.0)
Hemoglobin: 12.2 g/dL (ref 12.0–15.0)
MCH: 31.7 pg (ref 26.0–34.0)
MCHC: 34.3 g/dL (ref 30.0–36.0)
MCV: 92.5 fL (ref 80.0–100.0)
Platelets: 247 K/uL (ref 150–400)
RBC: 3.85 MIL/uL — ABNORMAL LOW (ref 3.87–5.11)
RDW: 11.5 % (ref 11.5–15.5)
WBC: 7.1 K/uL (ref 4.0–10.5)
nRBC: 0 % (ref 0.0–0.2)

## 2024-07-12 LAB — TYPE AND SCREEN
ABO/RH(D): A NEG
Antibody Screen: NEGATIVE

## 2024-07-12 MED ORDER — ACETAMINOPHEN 500 MG PO TABS
1000.0000 mg | ORAL_TABLET | Freq: Once | ORAL | Status: AC
  Administered 2024-07-12: 1000 mg via ORAL
  Filled 2024-07-12: qty 2

## 2024-07-12 NOTE — Discharge Instructions (Addendum)
 Kathryn Roberts,  You came to the MAU (Maternity Assessment Unit) today for vaginal bleeding.   Reasons to return to the MAU: - You start to experience increased abdominal pain - You experience increased vaginal bleeding  - You develop a fever (> 100.24F or 38C) Nothing to eat or drink after 2:00 am 07/13/24  Thank you for allowing us  to be a part of your care!  [facility] at [facility] [address] [place], [place] [id number]

## 2024-07-12 NOTE — MAU Note (Signed)
 Pt says she had a SAB last Thursday - [redacted] weeks gestation . [facility]- Dr Mat- had labs and U/S - on Thursday - No FHR .  Was bleeding. In office - gave her Cytotec  - 4 tabs inserted in vag on Thursday .  Started vag bleeding last Tuesday  Feels cramps  Has H/A  ( has had entire preg- but worse since Thursday ) - nausea- no vomiting  Bodyaches.  Takes Tramadol for pain - last time - 10am  Now- VB- pad light amt - dark red.  Says does not need Rhogam . Pt  called office this am - office returned call at 4pm - told to come here.

## 2024-07-12 NOTE — Progress Notes (Signed)
 History     CSN: [id number]  Arrival date and time: 07/12/24 1833   None     Chief Complaint  Patient presents with   Abdominal Pain   Vaginal Bleeding   HPI 38 yo G3P2 with first trimester miscarriage. She was given vaginal misoprostol  about 4 days ago. She has been bleeding for about 1 week. C/O pelvic cramping and long standing HA. HA now better after a dose of Tylenol  in MAU. No fever/chills. She was passing clots although bleeding has subsided.     Past Medical History:  Diagnosis Date   ADD (attention deficit disorder)    Allergy    Anemia    low iron   Anxiety    COVID    Depression    Family history of colon cancer    GERD (gastroesophageal reflux disease)    H/O varicella    History of adenomatous polyp of colon    History of gastric ulcer    History of kidney stones    History of shingles    2012--  residual intermittant rash   IBS (irritable bowel syndrome)    PONV (postoperative nausea and vomiting)    SUI (stress urinary incontinence, female)     Past Surgical History:  Procedure Laterality Date   CESAREAN SECTION N/A 02/10/2017   Procedure: CESAREAN SECTION;  Surgeon: Kelly Delon Milian, MD;  Location: [facility] BIRTHING SUITES;  Service: Obstetrics;  Laterality: N/A;   COLONOSCOPY W/ POLYPECTOMY  2007, 2011, 2017   rectal adenoma - 07   DILATION AND EVACUATION N/A 07/24/2021   Procedure: DILATATION AND EVACUATION;  Surgeon: Mat Browning, MD;  Location: [facility] OR;  Service: Gynecology;  Laterality: N/A;   LAPAROSCOPIC CHOLECYSTECTOMY  10-18-2005   PUBOVAGINAL SLING N/A 10/10/2014   Procedure: CARLOYN GLADE;  Surgeon: Arlena LILLETTE Gal, MD;  Location: [facility];  Service: Urology;  Laterality: N/A;   REPAIR LACERATION OF FOREHEAD  age 73    Family History  Problem Relation Age of Onset   Hypertension Mother    Alcohol abuse Brother    Diabetes Maternal Uncle    Diabetes Paternal Aunt    Seizures Paternal Uncle    Diabetes  Paternal Uncle    Diabetes Paternal Uncle    Stomach cancer Maternal Grandmother    Hypertension Maternal Grandmother    Diabetes Maternal Grandmother    Hypertension Maternal Grandfather    Cancer Maternal Grandfather        prostate   Heart disease Paternal Grandmother    Diabetes Paternal Grandmother    Cancer Cousin        pancreatic   Colon cancer Other    Esophageal cancer Neg Hx    Rectal cancer Neg Hx     Social History   Tobacco Use   Smoking status: Never   Smokeless tobacco: Never  Vaping Use   Vaping status: Never Used  Substance Use Topics   Alcohol use: Not Currently    Alcohol/week: 7.0 standard drinks of alcohol    Types: 7 Glasses of wine per week    Comment: one wine daily   Drug use: No    Allergies:  Allergies  Allergen Reactions   Codeine Nausea Only   Hydrocodone-Acetaminophen  Nausea Only    Pt states she can tolerate acetaminophen    Other Nausea And Vomiting    General anesthesia    Shellfish Allergy Diarrhea and Nausea And Vomiting    Pt reports extreme n/v/d after eating shelfish  Medications Prior to Admission  Medication Sig Dispense Refill Last Dose/Taking   acetaminophen  (TYLENOL ) 500 MG tablet Take 1,000 mg by mouth every 6 (six) hours as needed for mild pain.      ALPRAZolam (XANAX) 0.5 MG tablet Take 0.125 mg by mouth daily as needed for anxiety.      calcium carbonate (TUMS - DOSED IN MG ELEMENTAL CALCIUM) 500 MG chewable tablet Chew 2 tablets by mouth daily as needed for indigestion.      Carboxymethylcellul-Glycerin  (LUBRICATING EYE DROPS OP) Place 1 drop into both eyes daily as needed (dry eyes).      norethindrone-ethinyl estradiol-FE (LOESTRIN FE) 1-20 MG-MCG tablet Take 1 tablet by mouth daily.      omeprazole  (PRILOSEC ) 40 MG capsule Take 1 capsule (40 mg total) by mouth daily. 90 capsule 0     Review of Systems Physical Exam   Blood pressure 118/75, pulse 61, temperature 98.6 F (37 C), temperature source Oral, resp.  rate 12, height 5' 3.75 (1.619 m), weight 65.8 kg.  Physical Exam  MAU Course  Procedures  US  OB Transvaginal Result Date: 07/12/2024 CLINICAL DATA:  Vaginal bleeding, spontaneous abortion on Thursday. EXAM: TRANSVAGINAL OB ULTRASOUND TECHNIQUE: Transvaginal ultrasound was performed for complete evaluation of the gestation as well as the maternal uterus, adnexal regions, and pelvic cul-de-sac. COMPARISON:  None available this pregnancy. FINDINGS: Intrauterine gestational sac: None Yolk sac:  Not Visualized. Embryo:  Not Visualized. Maternal uterus/adnexae: The uterus is anteverted. No intrauterine gestational sac. The endometrium measures 12 mm, mildly heterogeneous with few foci of blood flow. Both ovaries are visualized and are normal. No adnexal mass. No pelvic free fluid. IMPRESSION: 1. No intrauterine gestation or findings of ectopic pregnancy. 2. Endometrial thickness of 12 mm with a few foci of vascularity. Sonographic findings are suspicious for retained products of conception in the correct clinical setting. Electronically Signed   By: Andrea Gasman M.D.   On: 07/12/2024 20:54    Results for orders placed or performed during the hospital encounter of 07/12/24 (from the past 24 hours)  Type and screen [facility]     Status: None   Collection Time: 07/12/24  7:37 PM  Result Value Ref Range   ABO/RH(D) A NEG    Antibody Screen NEG    Sample Expiration      07/15/2024,2359 Performed at [facility], [address] [place], [place] [address]   CBC     Status: Abnormal   Collection Time: 07/12/24  7:40 PM  Result Value Ref Range   WBC 7.1 4.0 - 10.5 K/uL   RBC 3.85 (L) 3.87 - 5.11 MIL/uL   Hemoglobin 12.2 12.0 - 15.0 g/dL   HCT 64.3 (L) 63.9 - 53.9 %   MCV 92.5 80.0 - 100.0 fL   MCH 31.7 26.0 - 34.0 pg   MCHC 34.3 30.0 - 36.0 g/dL   RDW 88.4 88.4 - 84.4 %   Platelets 247 150 - 400 K/uL   nRBC 0.0 0.0 - 0.2 %    MDM   Assessment and Plan  38 yo  G3P2 with possible retained POC. Bleeding now minimal and afebrile. D/W options with patient and husband including expectant management, repeat misoprostol  or D&E. She wants D&E. Will keep NPO after 2:00 am 07/13/24. Office will schedule D&E for tomorrow. She states she understands and agrees.  Kathryn Roberts E Tykeem Lanzer II 07/12/2024, 10:56 PM

## 2024-07-12 NOTE — MAU Provider Note (Signed)
 Chief Complaint:  Abdominal Pain and Vaginal Bleeding   HPI   None    Kathryn Roberts is a 38 y.o. [id number] presenting to maternity admissions reporting vaginal bleeding. This began Tues 9/9. She then was found to have a SAB on Thurs 9/11 at [redacted] weeks gestation. She was given Cytotec  (4 tabs vaginally) that day. She then had increased bleeding on Friday 9/12, along with increased cramping. She continued to experience bleeding with passage of small clots and cramping over the weekend and into today. Also has headache, all over, worsened with light which had been occurring throughout her pregnancy. Has been taking Tramadol for pain (prescribed by outpatient OB-Gyn); last dose at 10am today.  Friend and husband present for emotional support.  Pregnancy Course: Received care at [facility].  Past Medical History:  Diagnosis Date   ADD (attention deficit disorder)    Allergy    Anemia    low iron   Anxiety    COVID    Depression    Family history of colon cancer    GERD (gastroesophageal reflux disease)    H/O varicella    History of adenomatous polyp of colon    History of gastric ulcer    History of kidney stones    History of shingles    2012--  residual intermittant rash   IBS (irritable bowel syndrome)    PONV (postoperative nausea and vomiting)    SUI (stress urinary incontinence, female)    OB History  Gravida Para Term Preterm AB Living  3 2 2   2   SAB IAB Ectopic Multiple Live Births     0 2    # Outcome Date GA Lbr Len/2nd Weight Sex Type Anes PTL Lv  3 Gravida           2 Term 02/10/17 [redacted]w[redacted]d  3755 g F CS-LTranv Spinal  LIV  1 Term 10/20/12 [redacted]w[redacted]d 05:30 / 01:58 3535 g F Vag-Vacuum EPI  LIV   Past Surgical History:  Procedure Laterality Date   CESAREAN SECTION N/A 02/10/2017   Procedure: CESAREAN SECTION;  Surgeon: Kelly Delon Milian, MD;  Location: [facility] BIRTHING SUITES;  Service: Obstetrics;  Laterality: N/A;   COLONOSCOPY W/ POLYPECTOMY  2007, 2011, 2017    rectal adenoma - 07   DILATION AND EVACUATION N/A 07/24/2021   Procedure: DILATATION AND EVACUATION;  Surgeon: Mat Browning, MD;  Location: [facility] OR;  Service: Gynecology;  Laterality: N/A;   LAPAROSCOPIC CHOLECYSTECTOMY  10-18-2005   PUBOVAGINAL SLING N/A 10/10/2014   Procedure: CARLOYN GLADE;  Surgeon: Arlena LILLETTE Gal, MD;  Location: [facility];  Service: Urology;  Laterality: N/A;   REPAIR LACERATION OF FOREHEAD  age 63   Family History  Problem Relation Age of Onset   Hypertension Mother    Alcohol abuse Brother    Diabetes Maternal Uncle    Diabetes Paternal Aunt    Seizures Paternal Uncle    Diabetes Paternal Uncle    Diabetes Paternal Uncle    Stomach cancer Maternal Grandmother    Hypertension Maternal Grandmother    Diabetes Maternal Grandmother    Hypertension Maternal Grandfather    Cancer Maternal Grandfather        prostate   Heart disease Paternal Grandmother    Diabetes Paternal Grandmother    Cancer Cousin        pancreatic   Colon cancer Other    Esophageal cancer Neg Hx    Rectal cancer Neg Hx  Social History   Tobacco Use   Smoking status: Never   Smokeless tobacco: Never  Vaping Use   Vaping status: Never Used  Substance Use Topics   Alcohol use: Not Currently    Alcohol/week: 7.0 standard drinks of alcohol    Types: 7 Glasses of wine per week    Comment: one wine daily   Drug use: No   Allergies  Allergen Reactions   Codeine Nausea Only   Hydrocodone-Acetaminophen  Nausea Only   Other Nausea And Vomiting    General anesthesia    Shellfish Allergy Diarrhea and Nausea And Vomiting    Pt reports extreme n/v/d after eating shelfish   Medications Prior to Admission  Medication Sig Dispense Refill Last Dose/Taking   acetaminophen  (TYLENOL ) 500 MG tablet Take 1,000 mg by mouth every 6 (six) hours as needed for mild pain.      ALPRAZolam (XANAX) 0.5 MG tablet Take 0.125 mg by mouth daily as needed for anxiety.       calcium carbonate (TUMS - DOSED IN MG ELEMENTAL CALCIUM) 500 MG chewable tablet Chew 2 tablets by mouth daily as needed for indigestion.      Carboxymethylcellul-Glycerin  (LUBRICATING EYE DROPS OP) Place 1 drop into both eyes daily as needed (dry eyes).      norethindrone-ethinyl estradiol-FE (LOESTRIN FE) 1-20 MG-MCG tablet Take 1 tablet by mouth daily.      omeprazole  (PRILOSEC ) 40 MG capsule Take 1 capsule (40 mg total) by mouth daily. 90 capsule 0     I have reviewed patient's Past Medical Hx, Surgical Hx, Family Hx, Social Hx, medications and allergies.   ROS  Pertinent items noted in HPI and remainder of comprehensive ROS otherwise negative.   PHYSICAL EXAM  Patient Vitals for the past 24 hrs:  BP Temp Temp src Pulse Resp Height Weight  07/12/24 1926 118/75 98.6 F (37 C) Oral 61 12 5' 3.75 (1.619 m) 65.8 kg    Constitutional: Well-developed, well-nourished female in no acute distress.  HEENT: atraumatic, normocephalic. Neck has normal ROM. EOM intact. Cardiovascular: warm and well-perfused Respiratory: normal effort, no problems with respiration noted MSK: grossly normal ROM Skin: warm and dry. Acyanotic, no jaundice or pallor. Neurologic: Alert and oriented x 4. No abnormal coordination. Psychiatric: Normal mood. Speech not slurred, not rapid/pressured. Patient is cooperative.  Labs: Results for orders placed or performed during the hospital encounter of 07/12/24 (from the past 24 hours)  Type and screen [facility]     Status: None (Preliminary result)   Collection Time: 07/12/24  7:37 PM  Result Value Ref Range   ABO/RH(D) PENDING    Antibody Screen PENDING    Sample Expiration      07/15/2024,2359 Performed at [facility], [address], [place], [place] [address]   CBC     Status: Abnormal   Collection Time: 07/12/24  7:40 PM  Result Value Ref Range   WBC 7.1 4.0 - 10.5 K/uL   RBC 3.85 (L) 3.87 - 5.11 MIL/uL   Hemoglobin 12.2 12.0 -  15.0 g/dL   HCT 64.3 (L) 63.9 - 53.9 %   MCV 92.5 80.0 - 100.0 fL   MCH 31.7 26.0 - 34.0 pg   MCHC 34.3 30.0 - 36.0 g/dL   RDW 88.4 88.4 - 84.4 %   Platelets 247 150 - 400 K/uL   nRBC 0.0 0.0 - 0.2 %    Imaging:  No results found.   MDM & MAU COURSE  MDM:  High  MAU Course: Differential diagnosis considered for vaginal bleeding includes but is not limited to: retained products of conception, ectopic pregnancy, spontaneous abortion, gynecological cancer, AUB, adenomyosis, foreign body, trauma  Given Tylenol  1000 mg once for pain CBC with Hgb of  US  showing   Orders Placed This Encounter  Procedures   US  OB Transvaginal   CBC   Type and screen [facility]   Meds ordered this encounter  Medications   acetaminophen  (TYLENOL ) tablet 1,000 mg    ASSESSMENT   1. Vaginal bleeding     PLAN  Discharge home in stable condition with return precautions.      Allergies as of 07/12/2024       Reactions   Codeine Nausea Only   Hydrocodone-acetaminophen  Nausea Only   Other Nausea And Vomiting   General anesthesia    Shellfish Allergy Diarrhea, Nausea And Vomiting   Pt reports extreme n/v/d after eating shelfish     Med Rec must be completed prior to using this SMART[employer]***       Alan Flies, MD

## 2024-07-13 ENCOUNTER — Encounter (HOSPITAL_COMMUNITY): Payer: Self-pay | Admitting: Anesthesiology

## 2024-07-13 ENCOUNTER — Encounter (HOSPITAL_COMMUNITY): Admission: RE | Payer: Self-pay | Source: Home / Self Care

## 2024-07-13 ENCOUNTER — Ambulatory Visit (HOSPITAL_COMMUNITY): Admission: RE | Admit: 2024-07-13 | Source: Home / Self Care | Admitting: Obstetrics and Gynecology

## 2024-07-13 SURGERY — DILATION AND EVACUATION, UTERUS
Anesthesia: Choice

## 2024-09-01 ENCOUNTER — Ambulatory Visit
Admission: EM | Admit: 2024-09-01 | Discharge: 2024-09-01 | Disposition: A | Discharge status: Home / Self Care | Attending: Family Medicine | Admitting: Family Medicine

## 2024-09-01 ENCOUNTER — Encounter: Payer: Self-pay | Admitting: Emergency Medicine

## 2024-09-01 DIAGNOSIS — R1024 Suprapubic pain: Secondary | ICD-10-CM | POA: Insufficient documentation

## 2024-09-01 DIAGNOSIS — N898 Other specified noninflammatory disorders of vagina: Secondary | ICD-10-CM | POA: Insufficient documentation

## 2024-09-01 DIAGNOSIS — R300 Dysuria: Secondary | ICD-10-CM | POA: Insufficient documentation

## 2024-09-01 DIAGNOSIS — R112 Nausea with vomiting, unspecified: Secondary | ICD-10-CM | POA: Insufficient documentation

## 2024-09-01 LAB — POCT URINE DIPSTICK
Bilirubin, UA: NEGATIVE
Blood, UA: NEGATIVE
Glucose, UA: NEGATIVE mg/dL
Ketones, POC UA: NEGATIVE mg/dL
Leukocytes, UA: NEGATIVE
Nitrite, UA: NEGATIVE
POC PROTEIN,UA: NEGATIVE
Spec Grav, UA: <=1.005 — AB (ref 1.010–1.025)
Urobilinogen, UA: 0.2 U/dL
pH, UA: 6 (ref 5.0–8.0)

## 2024-09-01 MED ORDER — ONDANSETRON 4 MG PO TBDP: 4.0000 mg | ORAL_TABLET | Freq: Three times a day (TID) | ORAL | 0 refills | Status: AC | PRN

## 2024-09-01 MED ORDER — CEPHALEXIN 500 MG PO CAPS: 500.0000 mg | ORAL_CAPSULE | Freq: Two times a day (BID) | ORAL | 0 refills | Status: AC

## 2024-09-01 NOTE — Discharge Instructions (Signed)
 Your urine test did not show evidence of urinary tract infection today but I have cultured the urine just to be sure.  I have sent in some nausea medication and antibiotic to be taken while we wait for your urine culture and vaginal swab for further information.  We will let you know if these results tell us  to make any changes.  Follow-up for worsening or unresolving symptoms.

## 2024-09-01 NOTE — ED Triage Notes (Signed)
 Lower ABD and Back pain and states urine is cloudy x 1 week.

## 2024-09-02 LAB — CERVICOVAGINAL ANCILLARY ONLY
Bacterial Vaginitis (gardnerella): NEGATIVE
Candida Glabrata: NEGATIVE
Candida Vaginitis: NEGATIVE
Comment: NEGATIVE
Comment: NEGATIVE
Comment: NEGATIVE

## 2024-09-02 NOTE — ED Provider Notes (Signed)
 [facility]    CSN: [id number] Arrival date & time: 09/01/24  1817      History   Chief Complaint Chief Complaint  Patient presents with   Abdominal Pain    HPI Kathryn Roberts is a 38 y.o. female.   Patient presenting today with 1 week history of progressively worsening lower abdominal pain wrapping around to her back, cloudy urine, suprapubic pressure, dysuria, frequency.  Does also have some mild vaginal discharge as well.  Denies fever, chills, vomiting, hematuria.  States she is very prone to urinary tract infections and it always feels just like this.    Past Medical History:  Diagnosis Date   ADD (attention deficit disorder)    Allergy    Anemia    low iron   Anxiety    COVID    Depression    Family history of colon cancer    GERD (gastroesophageal reflux disease)    H/O varicella    History of adenomatous polyp of colon    History of gastric ulcer    History of kidney stones    History of shingles    2012--  residual intermittant rash   IBS (irritable bowel syndrome)    PONV (postoperative nausea and vomiting)    SUI (stress urinary incontinence, female)     Patient Active Problem List   Diagnosis Date Noted   Cesarean delivery delivered 02/10/2017   Stress incontinence in female 10/10/2014   HEMORRHOIDS, WITH BLEEDING 03/28/2010   IRRITABLE BOWEL SYNDROME 03/28/2010   History of colonic polyps 11/15/2005    Past Surgical History:  Procedure Laterality Date   CESAREAN SECTION N/A 02/10/2017   Procedure: CESAREAN SECTION;  Surgeon: Kelly Delon Milian, MD;  Location: [facility] BIRTHING SUITES;  Service: Obstetrics;  Laterality: N/A;   COLONOSCOPY W/ POLYPECTOMY  2007, 2011, 2017   rectal adenoma - 07   DILATION AND EVACUATION N/A 07/24/2021   Procedure: DILATATION AND EVACUATION;  Surgeon: Mat Browning, MD;  Location: [facility] OR;  Service: Gynecology;  Laterality: N/A;   LAPAROSCOPIC CHOLECYSTECTOMY  10-18-2005   PUBOVAGINAL SLING N/A 10/10/2014    Procedure: CARLOYN GLADE;  Surgeon: Arlena LILLETTE Gal, MD;  Location: [facility];  Service: Urology;  Laterality: N/A;   REPAIR LACERATION OF FOREHEAD  age 3    OB History     Gravida  3   Para  2   Term  2   Preterm      AB      Living  2      SAB      IAB      Ectopic      Multiple  0   Live Births  2            Home Medications    Prior to Admission medications   Medication Sig Start Date End Date Taking? Authorizing Provider  cephALEXin (KEFLEX) 500 MG capsule Take 1 capsule (500 mg total) by mouth 2 (two) times daily. 09/01/24  Yes Stuart Vernell Norris, PA-C  ondansetron  (ZOFRAN -ODT) 4 MG disintegrating tablet Take 1 tablet (4 mg total) by mouth every 8 (eight) hours as needed for nausea or vomiting. 09/01/24  Yes Stuart Vernell Norris, PA-C  acetaminophen  (TYLENOL ) 500 MG tablet Take 1,000 mg by mouth every 6 (six) hours as needed for mild pain.    [provider]    Family History Family History  Problem Relation Age of Onset   Hypertension Mother  Alcohol abuse Brother    Diabetes Maternal Uncle    Diabetes Paternal Aunt    Seizures Paternal Uncle    Diabetes Paternal Uncle    Diabetes Paternal Uncle    Stomach cancer Maternal Grandmother    Hypertension Maternal Grandmother    Diabetes Maternal Grandmother    Hypertension Maternal Grandfather    Cancer Maternal Grandfather        prostate   Heart disease Paternal Grandmother    Diabetes Paternal Grandmother    Cancer Cousin        pancreatic   Colon cancer Other    Esophageal cancer Neg Hx    Rectal cancer Neg Hx     Social History Social History   Tobacco Use   Smoking status: Never   Smokeless tobacco: Never  Vaping Use   Vaping status: Never Used  Substance Use Topics   Alcohol use: Not Currently    Alcohol/week: 7.0 standard drinks of alcohol    Types: 7 Glasses of wine per week    Comment: one wine daily   Drug use: No      Allergies   Codeine, Hydrocodone-acetaminophen , Other, and Shellfish allergy   Review of Systems Review of Systems Per HPI  Physical Exam Triage Vital Signs ED Triage Vitals  Encounter Vitals Group     BP 09/01/24 1914 128/82     Girls Systolic BP Percentile --      Girls Diastolic BP Percentile --      Boys Systolic BP Percentile --      Boys Diastolic BP Percentile --      Pulse Rate 09/01/24 1914 63     Resp 09/01/24 1914 18     Temp 09/01/24 1914 98 F (36.7 C)     Temp Source 09/01/24 1914 Oral     SpO2 09/01/24 1914 98 %     Weight --      Height --      Head Circumference --      Peak Flow --      Pain Score 09/01/24 1915 7     Pain Loc --      Pain Education --      Exclude from Growth Chart --    No data found.  Updated Vital Signs BP 128/82 (BP Location: Right Arm)   Pulse 63   Temp 98 F (36.7 C) (Oral)   Resp 18   LMP 08/16/2024 (Approximate)   SpO2 98%   Visual Acuity Right Eye Distance:   Left Eye Distance:   Bilateral Distance:    Right Eye Near:   Left Eye Near:    Bilateral Near:     Physical Exam Vitals and nursing note reviewed.  Constitutional:      Appearance: Normal appearance. She is not ill-appearing.  HENT:     Head: Atraumatic.  Eyes:     Extraocular Movements: Extraocular movements intact.     Conjunctiva/sclera: Conjunctivae normal.  Cardiovascular:     Rate and Rhythm: Normal rate.  Pulmonary:     Effort: Pulmonary effort is normal.  Abdominal:     General: Bowel sounds are normal. There is no distension.     Palpations: Abdomen is soft.     Tenderness: There is no abdominal tenderness. There is no right CVA tenderness, left CVA tenderness or guarding.  Genitourinary:    Comments: GU exam deferred, self swab performed Musculoskeletal:        General: Normal range of motion.  Cervical back: Normal range of motion and neck supple.  Skin:    General: Skin is warm and dry.  Neurological:     Mental  Status: She is alert and oriented to person, place, and time.  Psychiatric:        Mood and Affect: Mood normal.        Thought Content: Thought content normal.        Judgment: Judgment normal.      UC Treatments / Results  Labs (all labs ordered are listed, but only abnormal results are displayed) Labs Reviewed  POCT URINE DIPSTICK - Abnormal; Notable for the following components:      Result Value   Color, UA light yellow (*)    Spec Grav, UA <=1.005 (*)    All other components within normal limits  URINE CULTURE  CERVICOVAGINAL ANCILLARY ONLY    EKG   Radiology No results found.  Procedures Procedures (including critical care time)  Medications Ordered in UC Medications - No data to display  Initial Impression / Assessment and Plan / UC Course  I have reviewed the triage vital signs and the nursing notes.  Pertinent labs & imaging results that were available during my care of the patient were reviewed by me and considered in my medical decision making (see chart for details).     Vital signs and exam overall reassuring today, vaginal swab pending as well as urine culture for further evaluation but urinalysis today without evidence of urinary tract infection.  Given her history of significant urinary tract infections will cover with Keflex and Zofran  while awaiting urine culture and vaginal swab results for further evaluation.  Adjust if needed based on these.  Final Clinical Impressions(s) / UC Diagnoses   Final diagnoses:  Dysuria  Suprapubic pain  Vaginal discharge  Nausea and vomiting, unspecified vomiting type     Discharge Instructions      Your urine test did not show evidence of urinary tract infection today but I have cultured the urine just to be sure.  I have sent in some nausea medication and antibiotic to be taken while we wait for your urine culture and vaginal swab for further information.  We will let you know if these results tell us  to make  any changes.  Follow-up for worsening or unresolving symptoms.    ED Prescriptions     Medication Sig Dispense Auth. Provider   cephALEXin (KEFLEX) 500 MG capsule Take 1 capsule (500 mg total) by mouth 2 (two) times daily. 10 capsule Stuart Vernell Norris, PA-C   ondansetron  (ZOFRAN -ODT) 4 MG disintegrating tablet Take 1 tablet (4 mg total) by mouth every 8 (eight) hours as needed for nausea or vomiting. 20 tablet Stuart Vernell Norris, [place]      PDMP not reviewed this encounter.   Stuart Vernell Norris, [place] 09/02/24 [phone]

## 2024-09-03 ENCOUNTER — Ambulatory Visit: Payer: Self-pay

## 2024-09-03 LAB — URINE CULTURE: Culture: <10000 — AB
